# Patient Record
Sex: Female | Born: 1955 | Race: White | Hispanic: No | Marital: Married | Smoking: Former smoker
Health system: Southern US, Community
[De-identification: ages and names within clinical notes are randomized; demographics above are authoritative.]

## PROBLEM LIST (undated history)

## (undated) DIAGNOSIS — E785 Hyperlipidemia, unspecified: Secondary | ICD-10-CM

## (undated) DIAGNOSIS — R519 Headache, unspecified: Secondary | ICD-10-CM

## (undated) DIAGNOSIS — E079 Disorder of thyroid, unspecified: Secondary | ICD-10-CM

## (undated) DIAGNOSIS — M1990 Unspecified osteoarthritis, unspecified site: Secondary | ICD-10-CM

## (undated) DIAGNOSIS — M419 Scoliosis, unspecified: Secondary | ICD-10-CM

## (undated) DIAGNOSIS — I10 Essential (primary) hypertension: Secondary | ICD-10-CM

## (undated) DIAGNOSIS — F32A Depression, unspecified: Secondary | ICD-10-CM

## (undated) DIAGNOSIS — Z974 Presence of external hearing-aid: Secondary | ICD-10-CM

## (undated) DIAGNOSIS — Z872 Personal history of diseases of the skin and subcutaneous tissue: Secondary | ICD-10-CM

## (undated) DIAGNOSIS — T4145XA Adverse effect of unspecified anesthetic, initial encounter: Secondary | ICD-10-CM

## (undated) DIAGNOSIS — T8859XA Other complications of anesthesia, initial encounter: Secondary | ICD-10-CM

## (undated) DIAGNOSIS — F419 Anxiety disorder, unspecified: Secondary | ICD-10-CM

## (undated) DIAGNOSIS — I341 Nonrheumatic mitral (valve) prolapse: Secondary | ICD-10-CM

## (undated) DIAGNOSIS — Z973 Presence of spectacles and contact lenses: Secondary | ICD-10-CM

## (undated) DIAGNOSIS — I499 Cardiac arrhythmia, unspecified: Secondary | ICD-10-CM

## (undated) DIAGNOSIS — L570 Actinic keratosis: Secondary | ICD-10-CM

## (undated) DIAGNOSIS — R51 Headache: Secondary | ICD-10-CM

## (undated) DIAGNOSIS — F329 Major depressive disorder, single episode, unspecified: Secondary | ICD-10-CM

## (undated) HISTORY — DX: Anxiety disorder, unspecified: F41.9

## (undated) HISTORY — PX: TYMPANOMASTOIDECTOMY: SHX34

## (undated) HISTORY — DX: Major depressive disorder, single episode, unspecified: F32.9

## (undated) HISTORY — DX: Hyperlipidemia, unspecified: E78.5

## (undated) HISTORY — DX: Depression, unspecified: F32.A

## (undated) HISTORY — DX: Disorder of thyroid, unspecified: E07.9

## (undated) HISTORY — DX: Actinic keratosis: L57.0

## (undated) HISTORY — PX: CERVICAL FUSION: SHX112

## (undated) HISTORY — PX: TONSILLECTOMY: SUR1361

## (undated) HISTORY — PX: CRYOTHERAPY: SHX1416

---

## 2000-01-09 ENCOUNTER — Other Ambulatory Visit: Admission: RE | Admit: 2000-01-09 | Discharge: 2000-01-09 | Payer: Self-pay | Admitting: Gynecology

## 2001-07-02 ENCOUNTER — Observation Stay (HOSPITAL_COMMUNITY): Admission: RE | Admit: 2001-07-02 | Discharge: 2001-07-02 | Payer: Self-pay | Admitting: Neurosurgery

## 2001-07-02 ENCOUNTER — Encounter: Payer: Self-pay | Admitting: Neurosurgery

## 2001-07-08 ENCOUNTER — Encounter: Payer: Self-pay | Admitting: Neurosurgery

## 2001-07-08 ENCOUNTER — Encounter: Admission: RE | Admit: 2001-07-08 | Discharge: 2001-07-08 | Payer: Self-pay | Admitting: Neurosurgery

## 2001-07-30 ENCOUNTER — Other Ambulatory Visit: Admission: RE | Admit: 2001-07-30 | Discharge: 2001-07-30 | Payer: Self-pay | Admitting: Gynecology

## 2001-08-14 ENCOUNTER — Encounter: Admission: RE | Admit: 2001-08-14 | Discharge: 2001-08-14 | Payer: Self-pay | Admitting: Neurosurgery

## 2001-08-14 ENCOUNTER — Encounter: Payer: Self-pay | Admitting: Neurosurgery

## 2004-08-26 ENCOUNTER — Ambulatory Visit: Payer: Self-pay | Admitting: Family Medicine

## 2004-10-17 ENCOUNTER — Ambulatory Visit: Payer: Self-pay | Admitting: General Surgery

## 2004-10-20 ENCOUNTER — Ambulatory Visit: Payer: Self-pay | Admitting: Family Medicine

## 2005-11-07 ENCOUNTER — Ambulatory Visit: Payer: Self-pay | Admitting: Family Medicine

## 2007-01-08 ENCOUNTER — Ambulatory Visit: Payer: Self-pay | Admitting: Family Medicine

## 2008-03-10 ENCOUNTER — Ambulatory Visit: Payer: Self-pay | Admitting: Family Medicine

## 2009-04-21 ENCOUNTER — Ambulatory Visit: Payer: Self-pay | Admitting: Family Medicine

## 2010-04-22 ENCOUNTER — Ambulatory Visit: Payer: Self-pay | Admitting: Family Medicine

## 2011-05-10 ENCOUNTER — Ambulatory Visit: Payer: Self-pay | Admitting: Internal Medicine

## 2011-07-14 IMAGING — MG MAM DGTL SCREENING MAMMO W/CAD
1 series · 4 of 4 positions shown · non-contrast
Comparison: none

RESULT:      Comparison is made to a study 10 March, 2008 as well as 08 January, 2007 and 20 October, 2004.

The breasts exhibit a moderate dense parenchymal pattern.  There is no
dominant mass. There are no malignant-appearing groupings of
microcalcification.

[R CC · right · 4 of 4 slices shown]
[im 1/4]
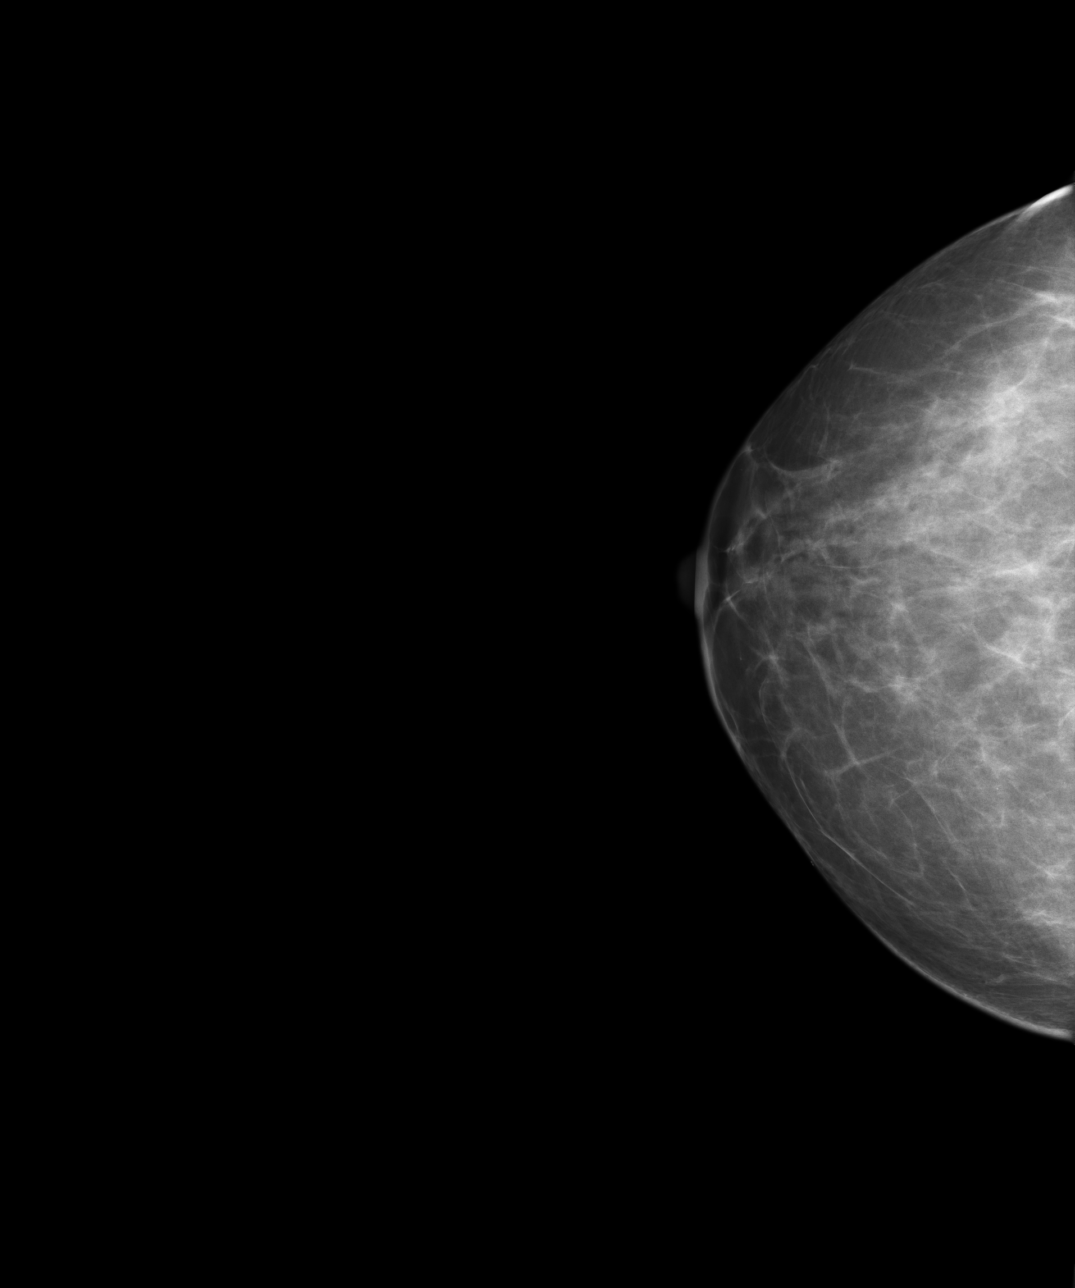
[im 2/4]
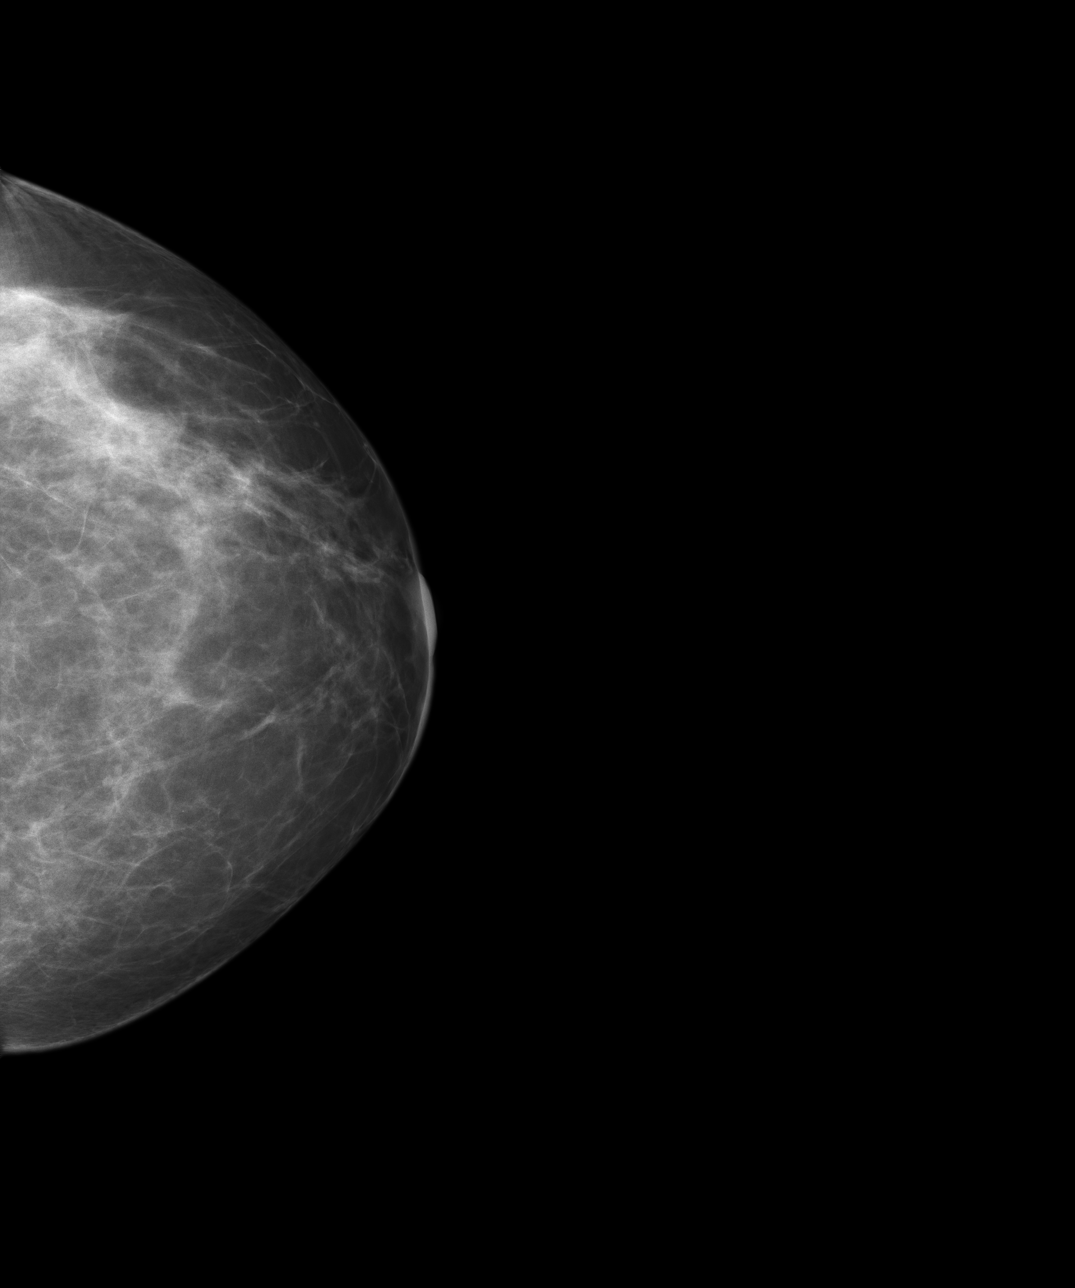
[im 3/4]
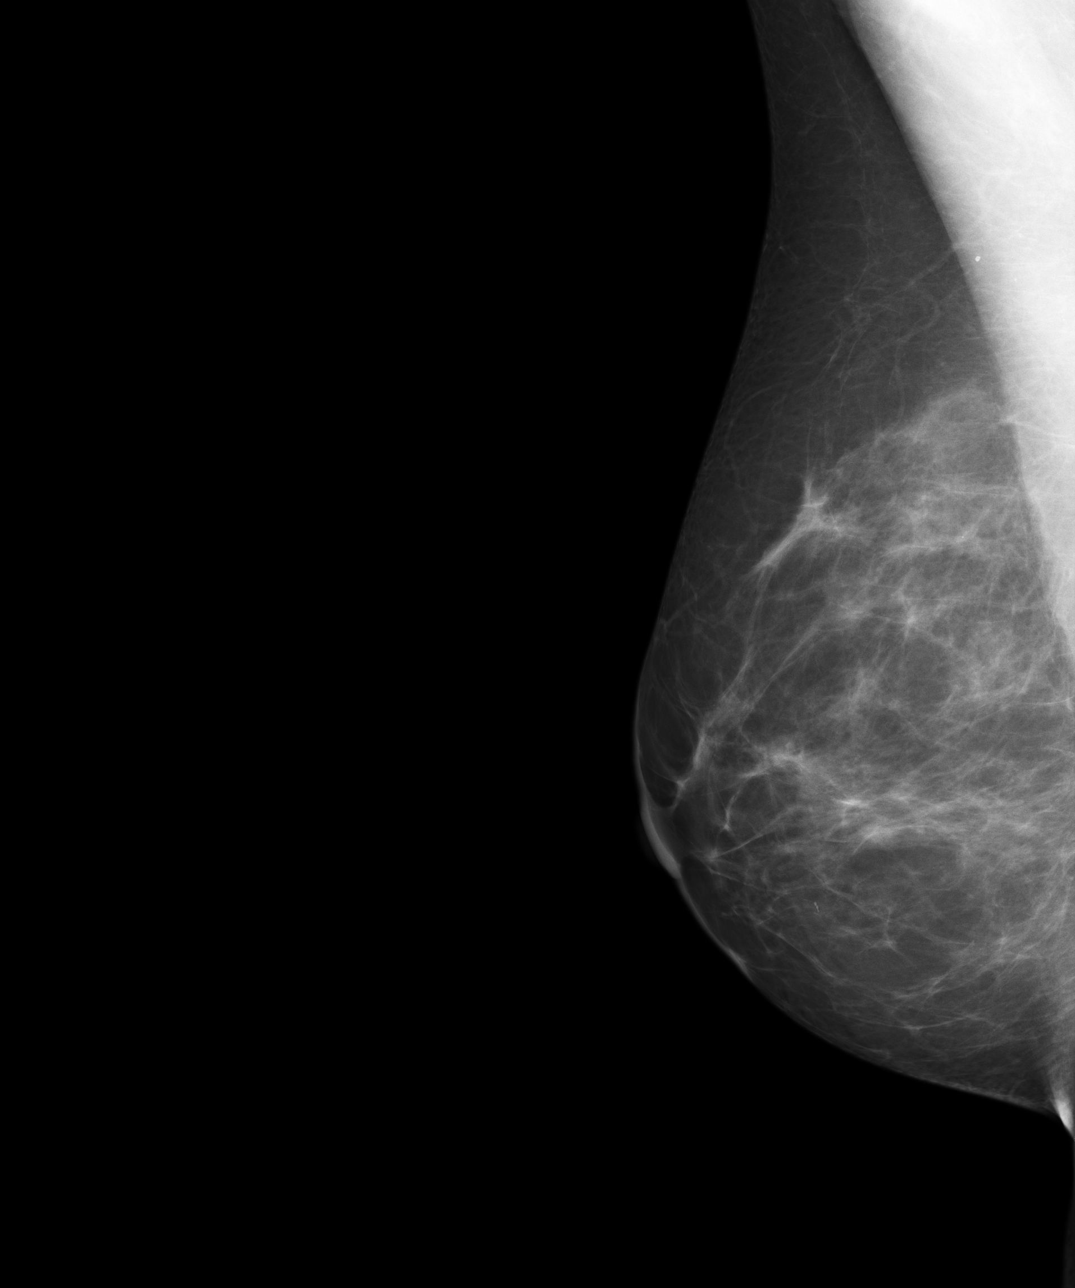
[im 4/4]
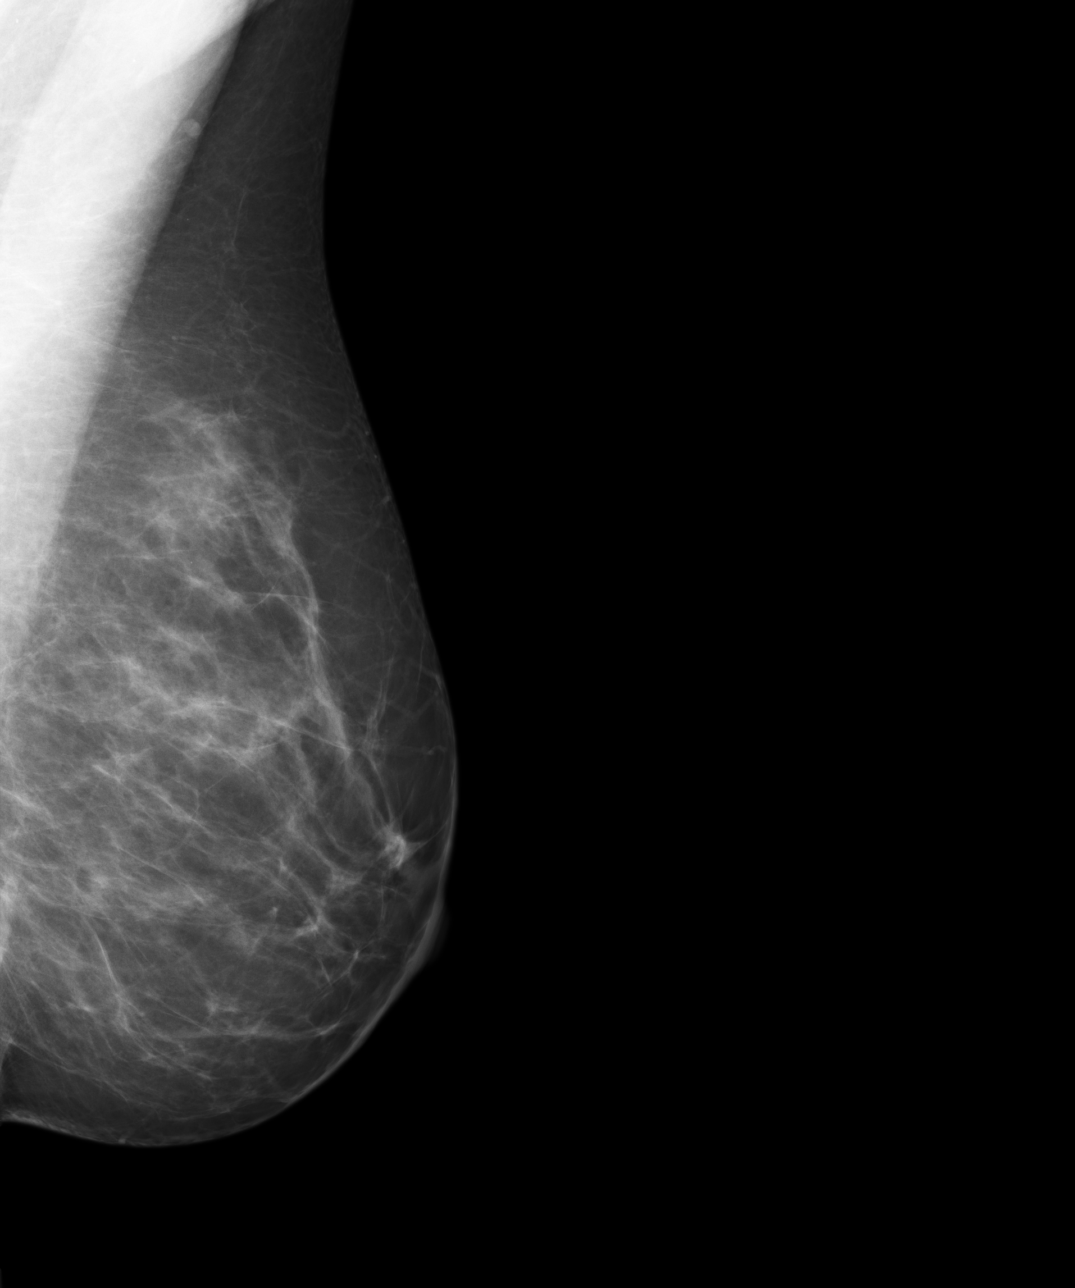

[4 of 4 positions shown; findings below may reference images not displayed]

IMPRESSION: 1.      I do not see findings suspicious for malignancy.
2.      BI-RADS:  Category 2- Benign Finding.

RECOMMENDATION:  Please continue to encourage yearly mammographic follow up.

A negative mammogram report does not preclude biopsy or other evaluation of
a clinically palpable or otherwise suspicious mass or lesion.  Breast cancer
may not be detected by mammography in up to 10% of cases.

## 2012-05-21 ENCOUNTER — Ambulatory Visit: Payer: Self-pay | Admitting: Internal Medicine

## 2012-08-13 ENCOUNTER — Ambulatory Visit: Payer: Self-pay | Admitting: Internal Medicine

## 2013-05-29 ENCOUNTER — Ambulatory Visit: Payer: Self-pay | Admitting: Internal Medicine

## 2013-09-01 ENCOUNTER — Ambulatory Visit: Payer: Self-pay | Admitting: Obstetrics and Gynecology

## 2013-10-09 HISTORY — PX: COLONOSCOPY: SHX174

## 2014-06-05 ENCOUNTER — Ambulatory Visit: Payer: Self-pay | Admitting: Gastroenterology

## 2014-06-08 LAB — PATHOLOGY REPORT

## 2014-06-24 ENCOUNTER — Ambulatory Visit: Payer: Self-pay | Admitting: Internal Medicine

## 2014-09-11 ENCOUNTER — Ambulatory Visit: Payer: Self-pay | Admitting: Obstetrics and Gynecology

## 2015-01-22 ENCOUNTER — Other Ambulatory Visit: Payer: Self-pay | Admitting: Unknown Physician Specialty

## 2015-01-22 DIAGNOSIS — E041 Nontoxic single thyroid nodule: Secondary | ICD-10-CM

## 2015-03-15 ENCOUNTER — Ambulatory Visit
Admission: RE | Admit: 2015-03-15 | Discharge: 2015-03-15 | Disposition: A | Discharge status: Home / Self Care | Payer: BLUE CROSS/BLUE SHIELD | Source: Ambulatory Visit | Attending: Unknown Physician Specialty | Admitting: Unknown Physician Specialty

## 2015-03-15 DIAGNOSIS — E042 Nontoxic multinodular goiter: Secondary | ICD-10-CM | POA: Diagnosis present

## 2015-03-15 DIAGNOSIS — E041 Nontoxic single thyroid nodule: Secondary | ICD-10-CM

## 2015-03-17 ENCOUNTER — Other Ambulatory Visit: Payer: Self-pay | Admitting: Unknown Physician Specialty

## 2015-03-17 DIAGNOSIS — D440 Neoplasm of uncertain behavior of thyroid gland: Secondary | ICD-10-CM

## 2015-04-09 DIAGNOSIS — Z872 Personal history of diseases of the skin and subcutaneous tissue: Secondary | ICD-10-CM

## 2015-04-09 HISTORY — DX: Personal history of diseases of the skin and subcutaneous tissue: Z87.2

## 2015-05-10 ENCOUNTER — Encounter: Payer: Self-pay | Admitting: *Deleted

## 2015-05-12 NOTE — Discharge Instructions (Signed)
[facility] °[facility] ° °POST OPERATIVE INSTRUCTIONS FOR DR. TROXLER AND DR. FOWLER °[facility] ° ° °1. Take your medication as prescribed.  Pain medication should be taken only as needed. ° °2. Keep the dressing clean, dry and intact. ° °3. Keep your foot elevated above the heart level for the first 48 hours. ° °4. Walking to the bathroom and brief periods of walking are acceptable, unless we have instructed you to be non-weight bearing. ° °5. Always wear your post-op shoe when walking.  Always use your crutches if you are to be non-weight bearing. ° °6. Do not take a shower. Baths are permissible as long as the foot is kept out of the water.  ° °7. Every hour you are awake:  °- Bend your knee 15 times. °- Flex foot 15 times °- Massage calf 15 times ° °8. Call [facility] ([phone]) if any of the following problems occur: °- You develop a temperature or fever. °- The bandage becomes saturated with blood. °- Medication does not stop your pain. °- Injury of the foot occurs. °- Any symptoms of infection including redness, odor, or red streaks running from wound. °-  ° °General Anesthesia, Care After °Refer to this sheet in the next few weeks. These instructions provide you with information on caring for yourself after your procedure. Your health care provider may also give you more specific instructions. Your treatment has been planned according to current medical practices, but problems sometimes occur. Call your health care provider if you have any problems or questions after your procedure. °WHAT TO EXPECT AFTER THE PROCEDURE °After the procedure, it is typical to experience: °· Sleepiness. °· Nausea and vomiting. °HOME CARE INSTRUCTIONS °· For the first 24 hours after general anesthesia: °¨ Have a responsible person with you. °¨ Do not drive a car. If you are alone, do not take public transportation. °¨ Do not drink alcohol. °¨ Do not take medicine  that has not been prescribed by your health care provider. °¨ Do not sign important papers or make important decisions. °¨ You may resume a normal diet and activities as directed by your health care provider. °· Change bandages (dressings) as directed. °· If you have questions or problems that seem related to general anesthesia, call the hospital and ask for the anesthetist or anesthesiologist on call. °SEEK MEDICAL CARE IF: °· You have nausea and vomiting that continue the day after anesthesia. °· You develop a rash. °SEEK IMMEDIATE MEDICAL CARE IF:  °· You have difficulty breathing. °· You have chest pain. °· You have any allergic problems. °Document Released: 01/01/2001 Document Revised: 09/30/2013 Document Reviewed: 04/10/2013 °ExitCare® Patient Information ©2015 [employer]. This information is not intended to replace advice given to you by your health care provider. Make sure you discuss any questions you have with your health care provider. ° °

## 2015-05-13 ENCOUNTER — Encounter: Payer: Self-pay | Admitting: Anesthesiology

## 2015-05-13 ENCOUNTER — Ambulatory Visit: Payer: BLUE CROSS/BLUE SHIELD | Admitting: Anesthesiology

## 2015-05-13 ENCOUNTER — Encounter
Admission: RE | Disposition: A | Discharge status: Home / Self Care | Payer: Self-pay | Source: Ambulatory Visit | Attending: Podiatry

## 2015-05-13 ENCOUNTER — Ambulatory Visit
Admission: RE | Admit: 2015-05-13 | Discharge: 2015-05-13 | Disposition: A | Discharge status: Home / Self Care | Payer: BLUE CROSS/BLUE SHIELD | Source: Ambulatory Visit | Attending: Podiatry | Admitting: Podiatry

## 2015-05-13 DIAGNOSIS — S92342A Displaced fracture of fourth metatarsal bone, left foot, initial encounter for closed fracture: Secondary | ICD-10-CM | POA: Insufficient documentation

## 2015-05-13 DIAGNOSIS — Z79899 Other long term (current) drug therapy: Secondary | ICD-10-CM | POA: Insufficient documentation

## 2015-05-13 DIAGNOSIS — Z79891 Long term (current) use of opiate analgesic: Secondary | ICD-10-CM | POA: Insufficient documentation

## 2015-05-13 DIAGNOSIS — Z791 Long term (current) use of non-steroidal anti-inflammatories (NSAID): Secondary | ICD-10-CM | POA: Diagnosis not present

## 2015-05-13 DIAGNOSIS — Z91018 Allergy to other foods: Secondary | ICD-10-CM | POA: Insufficient documentation

## 2015-05-13 DIAGNOSIS — Z9109 Other allergy status, other than to drugs and biological substances: Secondary | ICD-10-CM | POA: Insufficient documentation

## 2015-05-13 DIAGNOSIS — E785 Hyperlipidemia, unspecified: Secondary | ICD-10-CM | POA: Diagnosis not present

## 2015-05-13 DIAGNOSIS — Z7982 Long term (current) use of aspirin: Secondary | ICD-10-CM | POA: Diagnosis not present

## 2015-05-13 DIAGNOSIS — X58XXXA Exposure to other specified factors, initial encounter: Secondary | ICD-10-CM | POA: Diagnosis not present

## 2015-05-13 DIAGNOSIS — G8929 Other chronic pain: Secondary | ICD-10-CM | POA: Insufficient documentation

## 2015-05-13 DIAGNOSIS — I341 Nonrheumatic mitral (valve) prolapse: Secondary | ICD-10-CM | POA: Diagnosis not present

## 2015-05-13 DIAGNOSIS — I491 Atrial premature depolarization: Secondary | ICD-10-CM | POA: Diagnosis not present

## 2015-05-13 DIAGNOSIS — M216X2 Other acquired deformities of left foot: Secondary | ICD-10-CM | POA: Insufficient documentation

## 2015-05-13 DIAGNOSIS — I38 Endocarditis, valve unspecified: Secondary | ICD-10-CM | POA: Diagnosis not present

## 2015-05-13 DIAGNOSIS — I10 Essential (primary) hypertension: Secondary | ICD-10-CM | POA: Diagnosis not present

## 2015-05-13 DIAGNOSIS — F419 Anxiety disorder, unspecified: Secondary | ICD-10-CM | POA: Insufficient documentation

## 2015-05-13 DIAGNOSIS — Z87891 Personal history of nicotine dependence: Secondary | ICD-10-CM | POA: Diagnosis not present

## 2015-05-13 DIAGNOSIS — S92352A Displaced fracture of fifth metatarsal bone, left foot, initial encounter for closed fracture: Secondary | ICD-10-CM | POA: Insufficient documentation

## 2015-05-13 DIAGNOSIS — F329 Major depressive disorder, single episode, unspecified: Secondary | ICD-10-CM | POA: Insufficient documentation

## 2015-05-13 DIAGNOSIS — Z7951 Long term (current) use of inhaled steroids: Secondary | ICD-10-CM | POA: Insufficient documentation

## 2015-05-13 HISTORY — DX: Presence of spectacles and contact lenses: Z97.3

## 2015-05-13 HISTORY — DX: Nonrheumatic mitral (valve) prolapse: I34.1

## 2015-05-13 HISTORY — PX: METATARSAL OSTEOTOMY: SHX1641

## 2015-05-13 HISTORY — DX: Headache: R51

## 2015-05-13 HISTORY — DX: Personal history of diseases of the skin and subcutaneous tissue: Z87.2

## 2015-05-13 HISTORY — DX: Cardiac arrhythmia, unspecified: I49.9

## 2015-05-13 HISTORY — DX: Presence of external hearing-aid: Z97.4

## 2015-05-13 HISTORY — DX: Headache, unspecified: R51.9

## 2015-05-13 HISTORY — DX: Essential (primary) hypertension: I10

## 2015-05-13 HISTORY — DX: Unspecified osteoarthritis, unspecified site: M19.90

## 2015-05-13 HISTORY — PX: ORIF TOE FRACTURE: SHX5032

## 2015-05-13 HISTORY — DX: Other complications of anesthesia, initial encounter: T88.59XA

## 2015-05-13 HISTORY — DX: Scoliosis, unspecified: M41.9

## 2015-05-13 HISTORY — DX: Adverse effect of unspecified anesthetic, initial encounter: T41.45XA

## 2015-05-13 SURGERY — OSTEOTOMY, METATARSAL BONE
Anesthesia: Regional | Laterality: Left | Wound class: Clean

## 2015-05-13 MED ORDER — LACTATED RINGERS IV SOLN
INTRAVENOUS | Status: DC
  Administered 2015-05-13: 08:00:00 via INTRAVENOUS

## 2015-05-13 MED ORDER — OXYCODONE HCL 5 MG PO TABS: 5.0000 mg | ORAL_TABLET | Freq: Once | ORAL | Status: AC | PRN

## 2015-05-13 MED ORDER — GLYCOPYRROLATE 0.2 MG/ML IJ SOLN
INTRAMUSCULAR | Status: DC | PRN
  Administered 2015-05-13: 0.1 mg via INTRAVENOUS

## 2015-05-13 MED ORDER — ONDANSETRON HCL 4 MG/2ML IJ SOLN
INTRAMUSCULAR | Status: DC | PRN
  Administered 2015-05-13: 4 mg via INTRAVENOUS

## 2015-05-13 MED ORDER — PROPOFOL 10 MG/ML IV BOLUS
INTRAVENOUS | Status: DC | PRN
  Administered 2015-05-13: 150 mg via INTRAVENOUS

## 2015-05-13 MED ORDER — PROMETHAZINE HCL 25 MG/ML IJ SOLN: 12.5000 mg | Freq: Once | INTRAMUSCULAR | Status: DC | PRN

## 2015-05-13 MED ORDER — ROPIVACAINE HCL 5 MG/ML IJ SOLN
INTRAMUSCULAR | Status: DC | PRN
  Administered 2015-05-13: 40 mL via PERINEURAL

## 2015-05-13 MED ORDER — LIDOCAINE HCL (CARDIAC) 20 MG/ML IV SOLN
INTRAVENOUS | Status: DC | PRN
  Administered 2015-05-13: 50 mg via INTRATRACHEAL

## 2015-05-13 MED ORDER — MIDAZOLAM HCL 2 MG/2ML IJ SOLN
INTRAMUSCULAR | Status: DC | PRN
  Administered 2015-05-13 (×2): 2 mg via INTRAVENOUS

## 2015-05-13 MED ORDER — DEXAMETHASONE SODIUM PHOSPHATE 4 MG/ML IJ SOLN
INTRAMUSCULAR | Status: DC | PRN
  Administered 2015-05-13: 4 mg via PERINEURAL

## 2015-05-13 MED ORDER — SCOPOLAMINE 1 MG/3DAYS TD PT72
1.0000 | MEDICATED_PATCH | TRANSDERMAL | Status: DC
  Administered 2015-05-13: 1.5 mg via TRANSDERMAL

## 2015-05-13 MED ORDER — ENOXAPARIN SODIUM 40 MG/0.4ML ~~LOC~~ SOLN
40.0000 mg | Freq: Once | SUBCUTANEOUS | Status: AC
  Administered 2015-05-13: 40 mg via SUBCUTANEOUS

## 2015-05-13 MED ORDER — FENTANYL CITRATE (PF) 100 MCG/2ML IJ SOLN
INTRAMUSCULAR | Status: DC | PRN
  Administered 2015-05-13: 25 ug via INTRAVENOUS
  Administered 2015-05-13 (×2): 50 ug via INTRAVENOUS
  Administered 2015-05-13: 25 ug via INTRAVENOUS
  Administered 2015-05-13: 100 ug via INTRAVENOUS
  Administered 2015-05-13: 25 ug via INTRAVENOUS
  Administered 2015-05-13 (×2): 12.5 ug via INTRAVENOUS

## 2015-05-13 MED ORDER — CEFAZOLIN SODIUM-DEXTROSE 2-3 GM-% IV SOLR
2.0000 g | Freq: Once | INTRAVENOUS | Status: AC
  Administered 2015-05-13: 2 g via INTRAVENOUS

## 2015-05-13 MED ORDER — OXYCODONE HCL 5 MG/5ML PO SOLN
5.0000 mg | Freq: Once | ORAL | Status: AC | PRN
  Administered 2015-05-13: 5 mg via ORAL

## 2015-05-13 SURGICAL SUPPLY — 79 items
APL SKNCLS STERI-STRIP NONHPOA (GAUZE/BANDAGES/DRESSINGS) ×1
BANDAGE ELASTIC 4 CLIP NS LF (GAUZE/BANDAGES/DRESSINGS) ×3 IMPLANT
BENZOIN TINCTURE PRP APPL 2/3 (GAUZE/BANDAGES/DRESSINGS) ×3 IMPLANT
BIT DRILL 2 FAST STEP (BIT) ×3 IMPLANT
BLADE CRESCENTIC (BLADE) IMPLANT
BLADE MED AGGRESSIVE (BLADE) IMPLANT
BLADE MINI RND TIP GREEN BEAV (BLADE) IMPLANT
BLADE OSC/SAGITTAL 5.5X25 (BLADE) IMPLANT
BLADE OSC/SAGITTAL MD 5.5X18 (BLADE) IMPLANT
BLADE OSC/SAGITTAL MD 9X18.5 (BLADE) ×3 IMPLANT
BLADE OSCILLATING/SAGITTAL (BLADE) ×3
BLADE SURG 15 STRL LF DISP TIS (BLADE) IMPLANT
BLADE SURG 15 STRL SS (BLADE)
BLADE SW THK.38XMED LNG THN (BLADE) ×1 IMPLANT
BNDG CMPR 75X41 PLY HI ABS (GAUZE/BANDAGES/DRESSINGS) ×1
BNDG ESMARK 4X12 TAN STRL LF (GAUZE/BANDAGES/DRESSINGS) ×3 IMPLANT
BNDG GAUZE 4.5X4.1 6PLY STRL (MISCELLANEOUS) ×3 IMPLANT
BNDG STRETCH 4X75 STRL LF (GAUZE/BANDAGES/DRESSINGS) ×3 IMPLANT
BUR EGG 4X8 MED (BURR) IMPLANT
BUR STRYKR EGG 5.0 (BURR) IMPLANT
CANISTER SUCT 1200ML W/VALVE (MISCELLANEOUS) ×3 IMPLANT
CAST PADDING 3X4FT ST 30246 (SOFTGOODS) ×4
CLOSURE WOUND 1/4X4 (GAUZE/BANDAGES/DRESSINGS) ×1
COVER PIN YLW 0.028-062 (MISCELLANEOUS) IMPLANT
CUFF TOURN SGL QUICK 18 (TOURNIQUET CUFF) ×3 IMPLANT
DRAPE FLUOR MINI C-ARM 54X84 (DRAPES) ×3 IMPLANT
DRILL BIT 3.5MM ×3 IMPLANT
DRILL WIRE PASS (DRILL) IMPLANT
DURAPREP 26ML APPLICATOR (WOUND CARE) ×3 IMPLANT
ETHIBOND 2 0 GREEN CT 2 30IN (SUTURE) IMPLANT
ETHICON SURGICAL STAINLESS STEELE WIRE (Wire) ×3 IMPLANT
GAUZE PETRO XEROFOAM 1X8 (MISCELLANEOUS) ×3 IMPLANT
GAUZE PETRO XEROFOAM 5X9 (MISCELLANEOUS) IMPLANT
GAUZE SPONGE 4X4 12PLY STRL (GAUZE/BANDAGES/DRESSINGS) ×3 IMPLANT
GLOVE BIO SURGEON STRL SZ7.5 (GLOVE) ×6 IMPLANT
GLOVE BIO SURGEON STRL SZ8 (GLOVE) ×3 IMPLANT
GLOVE INDICATOR 8.0 STRL GRN (GLOVE) ×6 IMPLANT
GOWN STRL REUS W/ TWL XL LVL3 (GOWN DISPOSABLE) ×3 IMPLANT
GOWN STRL REUS W/TWL XL LVL3 (GOWN DISPOSABLE) ×9
K-WIRE DBL END TROCAR 6X.045 (WIRE) ×3
K-WIRE DBL END TROCAR 6X.062 (WIRE)
K-WIRE SMOOTH 1.8MM ×3 IMPLANT
KWIRE DBL END TROCAR 6X.045 (WIRE) ×1 IMPLANT
KWIRE DBL END TROCAR 6X.062 (WIRE) IMPLANT
NEEDLE HYPO 18GX1.5 BLUNT FILL (NEEDLE) IMPLANT
NEEDLE HYPO 25GX1X1/2 BEV (NEEDLE) IMPLANT
NS IRRIG 500ML POUR BTL (IV SOLUTION) ×3 IMPLANT
PACK EXTREMITY ARMC (MISCELLANEOUS) ×3 IMPLANT
PAD CAST CTTN 3X4 STRL (SOFTGOODS) ×2 IMPLANT
PAD GROUND ADULT SPLIT (MISCELLANEOUS) ×3 IMPLANT
PADDING CAST COTTON 3X4 STRL (SOFTGOODS) ×2
PLATE F3 FRAG HI FLEX RT (Plate) ×3 IMPLANT
RASP SM TEAR CROSS CUT (RASP) IMPLANT
SCREW 5.0 X 38 (Screw) ×2 IMPLANT
SCREW PEG 2.5X12 NONLOCK (Screw) ×6 IMPLANT
SCREW PEG 2.5X16 NONLOCK (Screw) IMPLANT
SCREW PEG LOCK 2.5X10 (Peg) ×6 IMPLANT
SCREW PEG LOCK 2.5X12 (Screw) ×3 IMPLANT
SCREW PEG LOCK 2.5X14 (Peg) ×3 IMPLANT
SPLINT CAST 1 STEP 4X30 (MISCELLANEOUS) IMPLANT
SPLINT FAST PLASTER 5X30 (CAST SUPPLIES) ×2
SPLINT PLASTER CAST FAST 5X30 (CAST SUPPLIES) ×1 IMPLANT
STOCKINETTE STRL 6IN 960660 (GAUZE/BANDAGES/DRESSINGS) ×3 IMPLANT
STRIP CLOSURE SKIN 1/4X4 (GAUZE/BANDAGES/DRESSINGS) ×2 IMPLANT
SUT ETHILON 4-0 (SUTURE) ×3
SUT ETHILON 4-0 FS2 18XMFL BLK (SUTURE) ×1
SUT ETHILON 5-0 FS-2 18 BLK (SUTURE) IMPLANT
SUT VIC AB 1 CT1 36 (SUTURE) IMPLANT
SUT VIC AB 2-0 CT1 27 (SUTURE)
SUT VIC AB 2-0 CT1 TAPERPNT 27 (SUTURE) IMPLANT
SUT VIC AB 2-0 SH 27 (SUTURE)
SUT VIC AB 2-0 SH 27XBRD (SUTURE) IMPLANT
SUT VIC AB 3-0 SH 27 (SUTURE)
SUT VIC AB 3-0 SH 27X BRD (SUTURE) IMPLANT
SUT VIC AB 4-0 FS2 27 (SUTURE) ×6 IMPLANT
SUT VICRYL AB 3-0 FS1 BRD 27IN (SUTURE) IMPLANT
SUTURE ETHLN 4-0 FS2 18XMF BLK (SUTURE) ×1 IMPLANT
SYRINGE 10CC LL (SYRINGE) IMPLANT
WATER STERILE IRR 1000ML POUR (IV SOLUTION) IMPLANT

## 2015-05-13 NOTE — Anesthesia Preprocedure Evaluation (Addendum)
Anesthesia Evaluation  Patient identified by MRN, date of birth, ID band  Reviewed: NPO status   History of Anesthesia Complications (+) PONV and history of anesthetic complications  Airway Mallampati: II  TM Distance: >3 FB Neck ROM: full   Comment: H/o ACDF Dental no notable dental hx.    Pulmonary neg pulmonary ROS, former smoker,    Pulmonary exam normal       Cardiovascular Exercise Tolerance: Good hypertension, Normal cardiovascular exam+ dysrhythmias (palpitations)  Abnl stress echo in 02/2015; Cards note 03/2015; Spoke to Dr. Nehemiah Massed > pt ok to proceed for surgery.  Lipids;   Neuro/Psych  Headaches, Anxiety    GI/Hepatic negative GI ROS, Neg liver ROS,   Endo/Other  negative endocrine ROS  Renal/GU negative Renal ROS  negative genitourinary   Musculoskeletal  (+) Arthritis -, acdfusion 2002;  Moderate scoliosis;    Abdominal   Peds  Hematology negative hematology ROS (+)   Anesthesia Other Findings Last aspirin 5 days ago;  Reproductive/Obstetrics negative OB ROS                            Anesthesia Physical Anesthesia Plan  ASA: II  Anesthesia Plan: General and Regional   Post-op Pain Management: GA combined w/ Regional for post-op pain   Induction:   Airway Management Planned:   Additional Equipment:   Intra-op Plan:   Post-operative Plan:   Informed Consent: I have reviewed the patients History and Physical, chart, labs and discussed the procedure including the risks, benefits and alternatives for the proposed anesthesia with the patient or authorized representative who has indicated his/her understanding and acceptance.     Plan Discussed with: CRNA  Anesthesia Plan Comments: (Popliteal block.)       Anesthesia Quick Evaluation

## 2015-05-13 NOTE — Anesthesia Postprocedure Evaluation (Signed)
  Anesthesia Post-op Note  Patient: Carolyn Richard  Procedure(s) Performed: Procedure(s) with comments: METATARSAL OSTEOTOMY LEFT 2ND (Left) - LEFT 2ND METATARSAL OPEN REDUCTION INTERNAL FIXATION (ORIF) METATARSAL (TOE) FRACTURE LEFT 4TH & 5TH (Left) - 4TH AND 5TH METATARSAL  Anesthesia type:General, Regional  Patient location: PACU  Post pain: Pain level controlled  Post assessment: Post-op Vital signs reviewed, Patient's Cardiovascular Status Stable, Respiratory Function Stable, Patent Airway and No signs of Nausea or vomiting  Post vital signs: Reviewed and stable  Last Vitals:  Filed Vitals:   05/13/15 1215  BP: 138/77  Pulse: 77  Temp:   Resp: 12    Level of consciousness: awake, alert  and patient cooperative  Complications: No apparent anesthesia complications  Pt has Lateral foot pain, in the area of the 5th digit.   Dr. Elvina Mattes unwrapped the foot and injected some local in the appropriate area.

## 2015-05-13 NOTE — H&P (Signed)
No changes to Hand P.  Reviewed this am prior to surgery.

## 2015-05-13 NOTE — Op Note (Signed)
Operative note   Surgeon: Dr. Albertine Patricia, DPM.    Assistant: None    Preop diagnosis: #1 displaced fourth metatarsal fracture left foot midshaft region. #2 Jones fracture fifth metatarsal left foot #3 plantar displaced deviated second metatarsal left foot    Postop diagnosis: Same    Procedure:   1. ORIF fourth metatarsal fracture left foot with plate and screw fixation   2. ORIF fifth metatarsal Jones fracture with 50 cannulated screw fixation intramedullary canal   3. Osteotomy second metatarsal left foot     EBL: Negligible    Anesthesia:general with popliteal block    Hemostasis: Ankle tourniquet 250 mmHg pressure    Specimen: None    Complications: None    Operative indications: Displaced fractures and chronic pain to the second metatarsophalangeal joint.    Procedure:  Patient was brought into the OR and placed on the operating table in thesupine position. After anesthesia was obtained theleft lower extremity was prepped and draped in usual sterile fashion.  Operative Report: At this time attention was directed to the dorsum of the left foot where a 4 cm dorsolinear skin incision made over the fourth metatarsal shaft. This incision was deepened sharp blunt dissection bleeders clamped and bovied as required. Extensive hemorrhage was noted throughout the tissues and the dorsum of the foot. The periosteal tissue overlying the bone was incised at this point from proximal shaft to the head of the metatarsal. The fracture fragment was noted and freed. This was then reduced by using combination of extraction and compression and the plate was sized up and fitted for the fracture margin. A screw was placed in distally and medially and the Biomet plate there is checked FluoroScan in excellent position and correction was noted. The screws were tightened rest of the way and 4 more screws were placed in the plate. A r irrigated. Periosteum was enclosed with 4 Vicryl in continuous  stitch as were deep superficial fascial layers. Skin was closed with 4 Vicryl in a subcuticular stitch.  Procedure #2: This time to his directed to the fifth metatarsal tuberosity where percutaneous wire from the fabrication of a screw set was utilized to run through the shaft from proximal to distal starting at the tuberosity area. Once is in place it was checked thoroughly with multiple views using FluoroScan to make sure he was running down the shaft of the metatarsal. Once this was confirmed the area was drilled and a 38 mm 5 cannulated screw was placed across the fracture margin and through the shaft and canal of the fifth metatarsal. This was seen to compress the fracture mental and fixated area nicely on FluoroScan. FluoroScan views showed the screw was completely within the metatarsal shaft. The guidewire was removed the area was irrigated and skin was closed with 1 5-0 nylon horizontal mattress suture.  Procedure #3: This time to his directed to the second metatarsal phalangeal joint dorsally and a 3 cm linear incision was made over this region. Incision was deepened with sharp and blunt dissection. Bleeders clamped and bovied as required. Long extension was notified retracted laterally. An incision was made through the tissue down to bone. The distal shaft and head was exposed and osteotomies are performed starting at the osteochondral junction running from dorsal distal to plantar proximal. The osteotomy was through and through the metatarsal head was shifted to a more medial position temporally fixated with a K wire checked FluoroScan for good position and correction. Once this was noted a 2.2 mm  screws placed across the osteotomy fixation and the K wire was removed. FluoroScan showed good correction fixation. His time the area was copiously irrigated periosteal Tissue closed with 4 Vicryl in a continuous stitch as were deep superficial fascial layers. Skin closed with 4 Vicryl subcuticular  stitch.  This point sterile compressive dressing was placed across wound consisting of Steri-Strips Xeroform gauze 4 x 4's Kling Kerlix. The tourniquet was released and prompt prevascular seen to return all digits. A posterior splint was placed on the left foot and leg in the operating room.    Patient tolerated the procedure and anesthesia well.  Was transported from the OR to the PACU with all vital signs stable and vascular status intact. To be discharged per routine protocol.  Will follow up in approximately 1 week in the outpatient clinic.

## 2015-05-13 NOTE — Transfer of Care (Signed)
Immediate Anesthesia Transfer of Care Note  Patient: Carolyn Richard  Procedure(s) Performed: Procedure(s) with comments: METATARSAL OSTEOTOMY LEFT 2ND (Left) - LEFT 2ND METATARSAL OPEN REDUCTION INTERNAL FIXATION (ORIF) METATARSAL (TOE) FRACTURE LEFT 4TH & 5TH (Left) - 4TH AND 5TH METATARSAL  Patient Location: PACU  Anesthesia Type: General, Regional  Level of Consciousness: awake, alert  and patient cooperative  Airway and Oxygen Therapy: Patient Spontanous Breathing and Patient connected to supplemental oxygen  Post-op Assessment: Post-op Vital signs reviewed, Patient's Cardiovascular Status Stable, Respiratory Function Stable, Patent Airway and No signs of Nausea or vomiting  Post-op Vital Signs: Reviewed and stable  Complications: No apparent anesthesia complications

## 2015-05-13 NOTE — Anesthesia Procedure Notes (Addendum)
Anesthesia Regional Block:  Popliteal block  Pre-Anesthetic Checklist: ,, timeout performed, Correct Patient, Correct Site, Correct Laterality, Correct Procedure, Correct Position, site marked, Risks and benefits discussed,  Surgical consent,  Pre-op evaluation,  At surgeon's request and post-op pain management  Laterality: Left  Prep: chloraprep       Needles:  Injection technique: Single-shot  Needle Type: Stimiplex     Needle Length: 9cm 9 cm Needle Gauge: 21 and 21 G    Additional Needles:  Procedures: ultrasound guided (picture in chart) and nerve stimulator Popliteal block  Nerve Stimulator or Paresthesia:  Response: ankle twitch, 0.5 mA,   Additional Responses:   Narrative:  Start time: 05/13/2015 8:36 AM End time: 05/13/2015 8:42 AM Injection made incrementally with aspirations every 5 mL.  Performed by: Personally  Anesthesiologist: Fidel Levy  Additional Notes: Functioning IV was confirmed and monitors applied. Ultrasound guidance: relevant anatomy identified, needle position confirmed, local anesthetic spread visualized around nerve(s)., vascular puncture avoided.  Image printed for medical record.  Negative aspiration and no paresthesias; incremental administration of local anesthetic. The patient tolerated the procedure well. Vitals signes recorded in RN notes.   Procedure Name: LMA Insertion Date/Time: 05/13/2015 9:48 AM Performed by: Mayme Genta Pre-anesthesia Checklist: Patient identified, Emergency Drugs available, Suction available, Timeout performed and Patient being monitored Patient Re-evaluated:Patient Re-evaluated prior to inductionOxygen Delivery Method: Circle system utilized Preoxygenation: Pre-oxygenation with 100% oxygen Intubation Type: IV induction LMA: LMA inserted LMA Size: 4.0 Number of attempts: 1 Placement Confirmation: positive ETCO2 and breath sounds checked- equal and bilateral Tube secured with: Tape

## 2015-05-13 NOTE — OR Nursing (Signed)
PATIENTS HEARING AIDS X 2 GIVEN TO PACU NURSE ASHLEY

## 2015-05-14 ENCOUNTER — Encounter: Payer: Self-pay | Admitting: Podiatry

## 2015-05-14 NOTE — Progress Notes (Signed)
Assisted Debabtrata Maji ANMD with left, popliteal block. Side rails up, monitors on throughout procedure. See vital signs in flow sheet. Tolerated Procedure well.  

## 2015-09-16 ENCOUNTER — Encounter: Admission: RE | Payer: Self-pay | Source: Ambulatory Visit

## 2015-09-16 ENCOUNTER — Ambulatory Visit: Admission: RE | Admit: 2015-09-16 | Payer: BLUE CROSS/BLUE SHIELD | Source: Ambulatory Visit | Admitting: Podiatry

## 2015-09-16 SURGERY — OSTEOTOMY, METATARSAL BONE
Anesthesia: General | Laterality: Right

## 2015-11-25 ENCOUNTER — Other Ambulatory Visit: Payer: Self-pay | Admitting: Obstetrics & Gynecology

## 2015-11-25 DIAGNOSIS — Z1239 Encounter for other screening for malignant neoplasm of breast: Secondary | ICD-10-CM

## 2015-12-07 ENCOUNTER — Ambulatory Visit
Admission: RE | Admit: 2015-12-07 | Discharge: 2015-12-07 | Disposition: A | Discharge status: Home / Self Care | Payer: BLUE CROSS/BLUE SHIELD | Source: Ambulatory Visit | Attending: Obstetrics & Gynecology | Admitting: Obstetrics & Gynecology

## 2015-12-07 ENCOUNTER — Other Ambulatory Visit: Payer: Self-pay | Admitting: Obstetrics & Gynecology

## 2015-12-07 DIAGNOSIS — Z1239 Encounter for other screening for malignant neoplasm of breast: Secondary | ICD-10-CM

## 2015-12-07 DIAGNOSIS — Z1231 Encounter for screening mammogram for malignant neoplasm of breast: Secondary | ICD-10-CM | POA: Diagnosis not present

## 2016-03-20 ENCOUNTER — Ambulatory Visit: Payer: BLUE CROSS/BLUE SHIELD | Attending: Unknown Physician Specialty

## 2017-03-06 ENCOUNTER — Other Ambulatory Visit: Payer: Self-pay | Admitting: Internal Medicine

## 2017-03-06 DIAGNOSIS — Z1231 Encounter for screening mammogram for malignant neoplasm of breast: Secondary | ICD-10-CM

## 2017-03-22 ENCOUNTER — Ambulatory Visit
Admission: RE | Admit: 2017-03-22 | Discharge: 2017-03-22 | Disposition: A | Discharge status: Home / Self Care | Payer: BLUE CROSS/BLUE SHIELD | Source: Ambulatory Visit | Attending: Internal Medicine | Admitting: Internal Medicine

## 2017-03-22 DIAGNOSIS — Z1231 Encounter for screening mammogram for malignant neoplasm of breast: Secondary | ICD-10-CM | POA: Insufficient documentation

## 2017-04-04 ENCOUNTER — Ambulatory Visit (INDEPENDENT_AMBULATORY_CARE_PROVIDER_SITE_OTHER): Payer: BLUE CROSS/BLUE SHIELD | Admitting: Certified Nurse Midwife

## 2017-04-04 ENCOUNTER — Encounter: Payer: Self-pay | Admitting: Certified Nurse Midwife

## 2017-04-04 VITALS — BP 112/62 | HR 61 | Ht 63.5 in | Wt 143.0 lb

## 2017-04-04 DIAGNOSIS — Z124 Encounter for screening for malignant neoplasm of cervix: Secondary | ICD-10-CM | POA: Diagnosis not present

## 2017-04-04 DIAGNOSIS — N952 Postmenopausal atrophic vaginitis: Secondary | ICD-10-CM | POA: Diagnosis not present

## 2017-04-04 DIAGNOSIS — Z01419 Encounter for gynecological examination (general) (routine) without abnormal findings: Secondary | ICD-10-CM | POA: Diagnosis not present

## 2017-04-04 MED ORDER — PRASTERONE 6.5 MG VA INST: 1.0000 | VAGINAL_INSERT | Freq: Every day | VAGINAL | 11 refills | Status: DC

## 2017-04-04 NOTE — Progress Notes (Signed)
Gynecology Annual Exam  PCP: Idelle Crouch, MD  Chief Complaint:  Chief Complaint  Patient presents with  . Gynecologic Exam    History of Present Illness:Carolyn Richard presents today for her annual exam. She is a 61 year old Caucasian/White female , G 2 P 2 0 0 2 , who is menopausal. . She isstill having problems with  vaginal dryness. She cannot take estrogen due to significant weight gain that happens in any form. She is s/p the ring, the cream, and the pill. She uses lubricants but is not satisfied.    Last Pap: 09/12/2014 Result: NORMAL, HPV Negative. Ever had a abnormal pap yes more than 20 years ago, and is s/p a CRYO  Last Mammogram: 03/22/2017 Result: Birads 1 Last Colonoscopy: 2015 - Normal  Last DEXA: 2018 Findings: osteopenia  Osteoporosis Prevention: Calcium and vitamin D, walking  Contraception Method: menopause  Her menses are absent - menopause since 1995  She has had no spotting.  The patient's past medical history is detailed in the past medical history section. hyperlipidemia, hypertension, anxiety/depression, psoriasis and thyroid mass. Since her last annual GYN exam 11/24/2015 , she had fractured two metatasals in her left foot while on a vacation in [place]. Had surgery when she returned to the [place]. Marland Kitchen She is sexually active and reports that she is experiencing. dryness.  There is no family history of breast cancer The patient does do occasional  self breast exams.  The patient drinks 2-3 glasses/week and does not smoke.      The patient denies current symptoms of depression.    Review of Systems: Review of Systems  Constitutional: Negative for chills, fever and weight loss.  HENT: Negative for congestion, sinus pain and sore throat.   Eyes: Negative for blurred vision and pain.  Respiratory: Negative for hemoptysis, shortness of breath and wheezing.   Cardiovascular: Negative for chest pain, palpitations and leg swelling.  Gastrointestinal: Negative for  abdominal pain, blood in stool, diarrhea, heartburn, nausea and vomiting.  Genitourinary: Negative for dysuria, frequency, hematuria and urgency.  Musculoskeletal: Negative for back pain, joint pain and myalgias.       Left foot pain due to fracture/surgery  Skin: Negative for itching and rash.  Neurological: Negative for dizziness, tingling and headaches.  Endo/Heme/Allergies: Negative for environmental allergies and polydipsia. Does not bruise/bleed easily.       Negative for hirsutism   Psychiatric/Behavioral: Negative for depression. The patient is not nervous/anxious and does not have insomnia.     Past Medical History:  Past Medical History:  Diagnosis Date  . Anxiety   . Complication of anesthesia    nausea  . Depression   . Dysrhythmia    PACs  . Headache    sinus - rare  . 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 Hyperlipidemia   . Hypertension   . Mitral valve prolapse   . Osteoarthritis    spine  . Scoliosis   . Thyroid mass   . Wears contact lenses    left eye  . Wears hearing aid    bilateral    Past Surgical History:  Past Surgical History:  Procedure Laterality Date  . CERVICAL FUSION     C3-4  . CESAREAN SECTION    . COLONOSCOPY  2015   small polyp found due again in 5 years  . CRYOTHERAPY     age 12  . METATARSAL OSTEOTOMY Left 05/13/2015  Procedure: METATARSAL OSTEOTOMY LEFT 2ND;  Surgeon: Albertine Patricia, DPM;  Location: [facility];  Service: Podiatry;  Laterality: Left;  LEFT 2ND METATARSAL  . ORIF TOE FRACTURE Left 05/13/2015   Procedure: OPEN REDUCTION INTERNAL FIXATION (ORIF) METATARSAL (TOE) FRACTURE LEFT 4TH & 5TH;  Surgeon: Albertine Patricia, DPM;  Location: [facility];  Service: Podiatry;  Laterality: Left;  4TH AND 5TH METATARSAL  . TONSILLECTOMY    . TYMPANOMASTOIDECTOMY Bilateral     Family History:  Family History  Problem Relation Age of Onset  . Hypertension Mother   . Pancreatic cancer Mother 41  .  Heart disease Father   . Hypertension Father   . Lymphoma Maternal Aunt   . Liver cancer Paternal Uncle   . Breast cancer Neg Hx     Social History:  Social History   Social History  . Marital status: Married    Spouse name: N/A  . Number of children: N/A  . Years of education: N/A   Occupational History  . Not on file.   Social History Main Topics  . Smoking status: Former Smoker    Quit date: 10/10/1999  . Smokeless tobacco: Never Used  . Alcohol use 1.8 oz/week    3 Glasses of wine per week  . Drug use: No  . Sexual activity: Yes    Birth control/ protection: Post-menopausal   Other Topics Concern  . Not on file   Social History Narrative  . No narrative on file    Allergies:  Allergies  Allergen Reactions  . Cashew Nut Oil Diarrhea    And nausea (all cashews)    Medications: Prior to Admission medications   Medication Sig Start Date End Date Taking? Authorizing Provider  ALPRAZolam (XANAX) 0.25 MG tablet Take 0.25 mg by mouth 2 (two) times daily.    [provider]  amLODipine (NORVASC) 5 MG tablet Take 5 mg by mouth daily. am    [provider]  aspirin 81 MG tablet Take 81 mg by mouth daily. PM    [provider]  B Complex-C (SUPER B COMPLEX PO) Take by mouth.    [provider]  buPROPion (ZYBAN) 150 MG 12 hr tablet Take 300 mg by mouth daily. AM    [provider]  calcium carbonate (TUMS - DOSED IN MG ELEMENTAL CALCIUM) 500 MG chewable tablet Chew 1 tablet by mouth daily.    [provider]  cetirizine (ZYRTEC) 10 MG tablet Take 10 mg by mouth daily as needed for allergies.    [provider]  Cholecalciferol (VITAMIN D-3 PO) Take by mouth.    [provider]  Chromium Picolinate (CHROMIUM PICOLATE PO) Take by mouth.    [provider]  Coenzyme Q10 (COQ10 PO) Take by mouth.    [provider]  fluticasone (FLONASE) 50 MCG/ACT nasal spray Place into both nostrils  daily. AM    [provider]  hydrochlorothiazide (HYDRODIURIL) 12.5 MG tablet Take 12.5 mg by mouth daily. AM    [provider]  naproxen sodium (ANAPROX) 220 MG tablet Take 440 mg by mouth daily. AM    [provider]  Omega-3 Fatty Acids (FISH OIL PO) Take by mouth.    [provider]  oxyCODONE-acetaminophen (PERCOCET/ROXICET) 5-325 MG per tablet Take by mouth every 4 (four) hours as needed for severe pain.    [provider]  simvastatin (ZOCOR) 10 MG tablet Take 10 mg by mouth daily. PM    [provider]  traZODone (DESYREL) 100 MG tablet Take 100 mg by mouth at bedtime.    [provider]    Physical Exam Vitals: There were no vitals taken for this visit.  General: NAD HEENT: normocephalic, anicteric Neck: no thyroid enlargement, no palpable nodules, no cervical lymphadenopathy  Pulmonary: No increased work of breathing, CTAB Cardiovascular: RRR, without murmur  Breast: Breast symmetrical, no tenderness, no palpable nodules or masses, no skin or nipple retraction present, no nipple discharge.  No axillary, infraclavicular or supraclavicular lymphadenopathy. Abdomen: Soft, non-tender, non-distended.  Umbilicus without lesions.  No hepatomegaly or masses palpable. No evidence of hernia. Genitourinary:  External: Normal external female genitalia.  Normal urethral meatus, normal Bartholin's and Skene's glands.    Vagina: Flattened rugae, no evidence of prolapse.    Cervix: Grossly normal in appearance, no bleeding, non-tender  Uterus: Retroverted, normal size, shape, and consistency, mobile, and non-tender  Adnexa: No adnexal masses, non-tender  Rectal: deferred  Lymphatic: no evidence of inguinal lymphadenopathy Extremities: no edema, erythema, or tenderness Neurologic: Grossly intact Psychiatric: mood appropriate, affect full     Assessment: 61 y.o. 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 Mammogram is up to date.  2) Cervical cancer screening - Pap was done. ASCCP guidelines and rational discussed.  Patient opts for yearly screening interval  3) Discussed treatment of vagal atrophy and dyspareunia with Intrarosa. Explained how to use and possible side effects. Two weeks of samples and a RX x1 year given.   4) Routine healthcare maintenance including cholesterol and diabetes screening managed by PCP and UTD  5) Colon cancer screening UTD.  6) Discussed calcium and vitamin D3 requirements  Dalia Heading, CNM

## 2017-04-10 ENCOUNTER — Encounter: Payer: Self-pay | Admitting: Certified Nurse Midwife

## 2017-04-10 DIAGNOSIS — I10 Essential (primary) hypertension: Secondary | ICD-10-CM | POA: Insufficient documentation

## 2017-04-10 DIAGNOSIS — M1990 Unspecified osteoarthritis, unspecified site: Secondary | ICD-10-CM | POA: Insufficient documentation

## 2017-04-10 DIAGNOSIS — F419 Anxiety disorder, unspecified: Secondary | ICD-10-CM | POA: Insufficient documentation

## 2017-04-10 DIAGNOSIS — F32A Depression, unspecified: Secondary | ICD-10-CM | POA: Insufficient documentation

## 2017-04-10 DIAGNOSIS — F329 Major depressive disorder, single episode, unspecified: Secondary | ICD-10-CM | POA: Insufficient documentation

## 2017-04-10 DIAGNOSIS — E785 Hyperlipidemia, unspecified: Secondary | ICD-10-CM | POA: Insufficient documentation

## 2017-04-10 LAB — IGP,CTNG,APTIMAHPV
Chlamydia, Nuc. Acid Amp: NEGATIVE
Gonococcus by Nucleic Acid Amp: NEGATIVE
HPV Aptima: NEGATIVE
PAP Smear Comment: 0

## 2017-07-02 ENCOUNTER — Other Ambulatory Visit: Payer: Self-pay | Admitting: Internal Medicine

## 2017-07-02 DIAGNOSIS — R0789 Other chest pain: Secondary | ICD-10-CM

## 2017-07-04 ENCOUNTER — Other Ambulatory Visit: Payer: Self-pay | Admitting: Internal Medicine

## 2017-07-04 DIAGNOSIS — R1084 Generalized abdominal pain: Secondary | ICD-10-CM

## 2017-07-09 ENCOUNTER — Ambulatory Visit: Payer: BLUE CROSS/BLUE SHIELD

## 2017-07-12 ENCOUNTER — Ambulatory Visit
Admission: RE | Admit: 2017-07-12 | Discharge: 2017-07-12 | Disposition: A | Discharge status: Home / Self Care | Payer: BLUE CROSS/BLUE SHIELD | Source: Ambulatory Visit | Attending: Internal Medicine | Admitting: Internal Medicine

## 2017-07-12 DIAGNOSIS — R0789 Other chest pain: Secondary | ICD-10-CM | POA: Diagnosis present

## 2017-07-12 DIAGNOSIS — M4184 Other forms of scoliosis, thoracic region: Secondary | ICD-10-CM | POA: Diagnosis not present

## 2017-07-12 DIAGNOSIS — I700 Atherosclerosis of aorta: Secondary | ICD-10-CM | POA: Insufficient documentation

## 2017-07-12 DIAGNOSIS — R1084 Generalized abdominal pain: Secondary | ICD-10-CM

## 2017-07-12 LAB — POCT I-STAT CREATININE: CREATININE: 0.8 mg/dL (ref 0.44–1.00)

## 2017-07-12 MED ORDER — IOPAMIDOL (ISOVUE-370) INJECTION 76%
75.0000 mL | Freq: Once | INTRAVENOUS | Status: AC | PRN
  Administered 2017-07-12: 75 mL via INTRAVENOUS

## 2018-04-24 ENCOUNTER — Other Ambulatory Visit: Payer: Self-pay | Admitting: Internal Medicine

## 2018-04-24 DIAGNOSIS — Z1231 Encounter for screening mammogram for malignant neoplasm of breast: Secondary | ICD-10-CM

## 2018-05-21 ENCOUNTER — Ambulatory Visit
Admission: RE | Admit: 2018-05-21 | Discharge: 2018-05-21 | Disposition: A | Discharge status: Home / Self Care | Payer: BLUE CROSS/BLUE SHIELD | Source: Ambulatory Visit | Attending: Internal Medicine | Admitting: Internal Medicine

## 2018-05-21 DIAGNOSIS — Z1231 Encounter for screening mammogram for malignant neoplasm of breast: Secondary | ICD-10-CM | POA: Insufficient documentation

## 2018-09-23 ENCOUNTER — Ambulatory Visit: Payer: Self-pay | Admitting: Certified Nurse Midwife

## 2018-09-23 ENCOUNTER — Encounter: Payer: Self-pay | Admitting: Certified Nurse Midwife

## 2018-09-23 ENCOUNTER — Ambulatory Visit (INDEPENDENT_AMBULATORY_CARE_PROVIDER_SITE_OTHER): Payer: BLUE CROSS/BLUE SHIELD | Admitting: Certified Nurse Midwife

## 2018-09-23 VITALS — BP 138/82 | Ht 63.0 in | Wt 139.0 lb

## 2018-09-23 DIAGNOSIS — M8580 Other specified disorders of bone density and structure, unspecified site: Secondary | ICD-10-CM | POA: Insufficient documentation

## 2018-09-23 DIAGNOSIS — M810 Age-related osteoporosis without current pathological fracture: Secondary | ICD-10-CM

## 2018-09-23 DIAGNOSIS — Z780 Asymptomatic menopausal state: Secondary | ICD-10-CM

## 2018-09-23 DIAGNOSIS — Z23 Encounter for immunization: Secondary | ICD-10-CM

## 2018-09-23 DIAGNOSIS — Z01419 Encounter for gynecological examination (general) (routine) without abnormal findings: Secondary | ICD-10-CM

## 2018-09-23 NOTE — Progress Notes (Signed)
Gynecology Annual Exam  PCP: Idelle Crouch, MD  Chief Complaint:  Chief Complaint  Patient presents with  . Gynecologic Exam    History of Present Illness:Carolyn Richard presents today for her annual exam. She is a 62 year old Caucasian/White female , G 2 P 2 0 0 2 , who is menopausal. . She is still having problems with  vaginal dryness. Lulu Riding after last annual with +/- results using it 2 x/week. May have been using it when having intercourse. She cannot take estrogen due to significant weight gain that happens in any form. She is also s/p the ring, the cream, and the pill. She has decided to just use  lubricants as she does not have frequent intercourse.  Last Pap: 04/04/2017 Result: NORMAL with reactive changes. Hx of abnormal pap: yes more than 20 years ago, and is s/p a CRYO  Last Mammogram: 05/22/2018 Result: Birads 1. The patient does do occasional  self breast exams. There is no family history of breast cancer  Last Colonoscopy: 2015 - Normal. Next due 2020 Last DEXA: 09/06/2017:  Findings: osteopenia (T scores -1.2 to -1.9) with Frax scores of 16%/1.6% Osteoporosis Prevention: Calcium and vitamin D, walking  Contraception Method: menopause  Her menses are absent - menopause since 1995  She has had no spotting.  The patient's past medical history is detailed in the past medical history section. hyperlipidemia, hypertension, anxiety/depression, psoriasis, osteopenia, fractured metatarsals, and thyroid mass. Since her last annual GYN exam 04/04/2017, she has had no other significant changes to her health.   The patient drinks 2-3 glasses/week and does not smoke.     Review of Systems: Review of Systems  Constitutional: Negative for chills, fever and weight loss.  HENT: Positive for congestion (sinus) and sore throat (sinus drainage). Negative for sinus pain.        Hearing changes  Eyes: Negative for blurred vision and pain.  Respiratory: Negative for hemoptysis,  shortness of breath and wheezing.   Cardiovascular: Negative for chest pain, palpitations and leg swelling.  Gastrointestinal: Negative for abdominal pain, blood in stool, diarrhea, heartburn, nausea and vomiting.  Genitourinary: Negative for dysuria, frequency, hematuria and urgency.  Musculoskeletal: Positive for joint pain. Negative for back pain and myalgias.       Ankle swelling  Skin: Negative for itching and rash.  Neurological: Positive for headaches (due to sinuses). Negative for dizziness and tingling.  Endo/Heme/Allergies: Positive for environmental allergies. Negative for polydipsia. Bruises/bleeds easily.       Negative for hirsutism Positive for hot flashes and heat intolerance  Psychiatric/Behavioral: Positive for depression. The patient is nervous/anxious. The patient does not have insomnia.     Past Medical History:  Past Medical History:  Diagnosis Date  . Anxiety   . Complication of anesthesia    nausea  . Depression   . Dysrhythmia    PACs  . Headache    sinus - rare  . 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 Hyperlipidemia   . Hypertension   . Mitral valve prolapse   . Osteoarthritis    spine  . Scoliosis   . Thyroid mass   . Wears contact lenses    left eye  . Wears hearing aid    bilateral    Past Surgical History:  Past Surgical History:  Procedure Laterality Date  . CERVICAL FUSION     C3-4  . CESAREAN SECTION    . COLONOSCOPY  2015   small polyp found due again in 5 years  . CRYOTHERAPY     age 41  . METATARSAL OSTEOTOMY Left 05/13/2015   Procedure: METATARSAL OSTEOTOMY LEFT 2ND;  Surgeon: Albertine Patricia, DPM;  Location: [facility];  Service: Podiatry;  Laterality: Left;  LEFT 2ND METATARSAL  . ORIF TOE FRACTURE Left 05/13/2015   Procedure: OPEN REDUCTION INTERNAL FIXATION (ORIF) METATARSAL (TOE) FRACTURE LEFT 4TH & 5TH;  Surgeon: Albertine Patricia, DPM;  Location: [facility];  Service: Podiatry;  Laterality: Left;   4TH AND 5TH METATARSAL  . TONSILLECTOMY    . TYMPANOMASTOIDECTOMY Bilateral     Family History:  Family History  Problem Relation Age of Onset  . Hypertension Mother   . Pancreatic cancer Mother 38  . Heart disease Father   . Hypertension Father   . Lymphoma Maternal Aunt   . Liver cancer Paternal Uncle   . Breast cancer Neg Hx     Social History:  Social History   Socioeconomic History  . Marital status: Married    Spouse name: Not on file  . Number of children: 2  . Years of education: Not on file  . Highest education level: Not on file  Occupational History  . Occupation: Retired  Scientific laboratory technician  . Financial resource strain: Not on file  . Food insecurity:    Worry: Not on file    Inability: Not on file  . Transportation needs:    Medical: Not on file    Non-medical: Not on file  Tobacco Use  . Smoking status: Former Smoker    Last attempt to quit: 10/10/1999    Years since quitting: 18.9  . Smokeless tobacco: Never Used  Substance and Sexual Activity  . Alcohol use: Yes    Alcohol/week: 3.0 standard drinks    Types: 3 Glasses of wine per week  . Drug use: No  . Sexual activity: Yes    Birth control/protection: Post-menopausal  Lifestyle  . Physical activity:    Days per week: Not on file    Minutes per session: Not on file  . Stress: Not on file  Relationships  . Social connections:    Talks on phone: Not on file    Gets together: Not on file    Attends religious service: Not on file    Active member of club or organization: Not on file    Attends meetings of clubs or organizations: Not on file    Relationship status: Not on file  . Intimate partner violence:    Fear of current or ex partner: Not on file    Emotionally abused: Not on file    Physically abused: Not on file    Forced sexual activity: Not on file  Other Topics Concern  . Not on file  Social History Narrative  . Not on file    Allergies:  Allergies  Allergen Reactions  . Cashew  Nut Oil Diarrhea    And nausea (all cashews)    Medications:  Current Outpatient Medications on File Prior to Visit  Medication Sig Dispense Refill  . ALPRAZolam (XANAX) 0.25 MG tablet Take 0.25 mg by mouth 2 (two) times daily.    Marland Kitchen amLODipine (NORVASC) 5 MG tablet Take 5 mg by mouth daily. am    . aspirin 81 MG tablet Take 81 mg by mouth daily. PM    . B Complex-C (SUPER B COMPLEX PO) Take by mouth.    . Biotin 1000 MCG tablet  Take by mouth.    Marland Kitchen buPROPion (WELLBUTRIN XL) 150 MG 24 hr tablet   1  . calcium carbonate (TUMS - DOSED IN MG ELEMENTAL CALCIUM) 500 MG chewable tablet Chew 1 tablet by mouth daily.    . cetirizine (ZYRTEC) 10 MG tablet Take 10 mg by mouth daily as needed for allergies.    . Cholecalciferol (VITAMIN D-3 PO) Take 2,000 Units by mouth daily.     . Chromium Picolinate (CHROMIUM PICOLATE PO) Take by mouth.    . Coenzyme Q10 (COQ10 PO) Take by mouth.    . fluticasone (FLONASE) 50 MCG/ACT nasal spray Place into both nostrils daily. AM    . hydrochlorothiazide (HYDRODIURIL) 12.5 MG tablet Take 12.5 mg by mouth daily. AM    . naproxen sodium (ANAPROX) 220 MG tablet Take 440 mg by mouth daily. AM    . Omega-3 Fatty Acids (FISH OIL PO) Take by mouth.    . Prasterone (INTRAROSA) 6.5 MG INST Place 1 suppository vaginally at bedtime. 30 each 11  . simvastatin (ZOCOR) 10 MG tablet Take 10 mg by mouth daily. PM    . traZODone (DESYREL) 100 MG tablet Take 100 mg by mouth at bedtime.     No current facility-administered medications on file prior to visit.    Physical Exam Vitals: BP 138/82 (BP Location: Left Arm, Patient Position: Sitting, Cuff Size: Normal)   Ht 5\' 3"  (1.6 m)   Wt 139 lb (63 kg)   BMI 24.62 kg/m   General: WF in  NAD HEENT: normocephalic, anicteric Neck: no thyroid enlargement, no palpable nodules, no cervical lymphadenopathy  Pulmonary: No increased work of breathing, CTAB Cardiovascular: RRR, without murmur  Breast: Breast symmetrical, no  tenderness, no palpable nodules or masses, no skin or nipple retraction present, no nipple discharge.  No axillary, infraclavicular or supraclavicular lymphadenopathy. Abdomen: Soft, non-tender, non-distended.  Umbilicus without lesions.  No hepatomegaly or masses palpable. No evidence of hernia. Genitourinary:  External: Normal external female genitalia.  Normal urethral meatus, normal Bartholin's and Skene's glands.    Vagina: Flattened rugae, no masses   Cervix: no bleeding, non-tender  Uterus: Retroverted, normal size, shape, and consistency, mobile, and non-tender  Adnexa: No adnexal masses, non-tender  Rectal: deferred  Lymphatic: no evidence of inguinal lymphadenopathy Extremities: no edema, erythema, or tenderness Neurologic: Grossly intact Psychiatric: mood appropriate, affect full     Assessment: 62 y.o. postmenopausal female -annual gyn exam Vaginal atrophy due to menopause  Plan:    1) Breast cancer screening - recommend monthly self breast exam. Mammogram is up to date.  2) Cervical cancer screening - Pap not done. ASCCP guidelines and rational discussed.  Patient opts for every 2 year  screening interval  3) Routine healthcare maintenance including cholesterol and diabetes screening managed by PCP and UTD. Flu vaccine today.  4) Colon cancer screening -colonoscopy 2015, next due next year.  5) Discussed calcium and vitamin D3 requirements. Explained role of exercise in preventing osteoporosis  6) RTO 1 year and prn.  Dalia Heading, CNM

## 2018-10-17 ENCOUNTER — Other Ambulatory Visit: Payer: Self-pay | Admitting: Internal Medicine

## 2018-10-17 ENCOUNTER — Encounter: Payer: Self-pay | Admitting: *Deleted

## 2018-10-17 ENCOUNTER — Telehealth: Payer: Self-pay | Admitting: *Deleted

## 2018-10-17 DIAGNOSIS — Z122 Encounter for screening for malignant neoplasm of respiratory organs: Secondary | ICD-10-CM

## 2018-10-17 DIAGNOSIS — R1084 Generalized abdominal pain: Secondary | ICD-10-CM

## 2018-10-17 NOTE — Telephone Encounter (Signed)
Received a referral for initial lung cancer screening scan.  Contacted the patient and obtained their smoking history, former smoker quit 2006 with a 38.75pkyr history  as well as answering questions related to screening process.  Patient denies signs of lung cancer such as weight loss or hemoptysis at this time.  Patient denies comorbidity that would prevent curative treatment if lung cancer were found.  Patient is scheduled for the Shared Decision Making Visit and CT scan on 10-29-2018 @1330 .

## 2018-10-25 ENCOUNTER — Ambulatory Visit
Admission: RE | Admit: 2018-10-25 | Discharge: 2018-10-25 | Disposition: A | Discharge status: Home / Self Care | Payer: BLUE CROSS/BLUE SHIELD | Source: Ambulatory Visit | Attending: Internal Medicine | Admitting: Internal Medicine

## 2018-10-25 ENCOUNTER — Ambulatory Visit: Payer: BLUE CROSS/BLUE SHIELD

## 2018-10-25 DIAGNOSIS — R1084 Generalized abdominal pain: Secondary | ICD-10-CM | POA: Insufficient documentation

## 2018-10-28 ENCOUNTER — Ambulatory Visit
Admission: RE | Admit: 2018-10-28 | Discharge: 2018-10-28 | Disposition: A | Discharge status: Home / Self Care | Payer: BLUE CROSS/BLUE SHIELD | Source: Ambulatory Visit | Attending: Oncology | Admitting: Oncology

## 2018-10-28 ENCOUNTER — Encounter: Payer: Self-pay | Admitting: *Deleted

## 2018-10-28 ENCOUNTER — Inpatient Hospital Stay: Payer: BLUE CROSS/BLUE SHIELD | Attending: Oncology | Admitting: Oncology

## 2018-10-28 DIAGNOSIS — Z122 Encounter for screening for malignant neoplasm of respiratory organs: Secondary | ICD-10-CM | POA: Diagnosis present

## 2018-10-28 DIAGNOSIS — Z87891 Personal history of nicotine dependence: Secondary | ICD-10-CM

## 2018-10-28 DIAGNOSIS — Z716 Tobacco abuse counseling: Secondary | ICD-10-CM

## 2018-10-28 NOTE — Progress Notes (Signed)
In accordance with CMS guidelines, patient has met eligibility criteria including age, absence of signs or symptoms of lung cancer.  Social History   Tobacco Use  . Smoking status: Former Smoker    Packs/day: 1.25    Years: 31.00    Pack years: 38.75    Types: Cigarettes    Last attempt to quit: 10/09/2004    Years since quitting: 14.0  . Smokeless tobacco: Never Used  Substance Use Topics  . Alcohol use: Yes    Alcohol/week: 3.0 standard drinks    Types: 3 Glasses of wine per week  . Drug use: No     A shared decision-making session was conducted prior to the performance of CT scan. This includes one or more decision aids, includes benefits and harms of screening, follow-up diagnostic testing, over-diagnosis, false positive rate, and total radiation exposure.  Counseling on the importance of adherence to annual lung cancer LDCT screening, impact of co-morbidities, and ability or willingness to undergo diagnosis and treatment is imperative for compliance of the program.  Counseling on the importance of continued smoking cessation for former smokers; the importance of smoking cessation for current smokers, and information about tobacco cessation interventions have been given to patient including [place] and 1800 quit [place] programs.  Written order for lung cancer screening with LDCT has been given to the patient and any and all questions have been answered to the best of my abilities.   Yearly follow up will be coordinated by Burgess Estelle, Thoracic Navigator.  Faythe Casa, NP 10/28/2018 10:58 AM

## 2018-10-29 ENCOUNTER — Ambulatory Visit: Payer: BLUE CROSS/BLUE SHIELD | Admitting: Oncology

## 2019-10-20 ENCOUNTER — Other Ambulatory Visit: Payer: Self-pay | Admitting: Internal Medicine

## 2019-10-20 DIAGNOSIS — Z1231 Encounter for screening mammogram for malignant neoplasm of breast: Secondary | ICD-10-CM

## 2019-10-22 ENCOUNTER — Telehealth: Payer: Self-pay

## 2019-10-22 NOTE — Telephone Encounter (Signed)
Patient has been notified that lung cancer screening CT scan is due currently or will be in near future. Confirmed that patient is within the appropriate age range, and asymptomatic, (no signs or symptoms of lung cancer). Patient denies illness that would prevent curative treatment for lung cancer if found. Verified smoking history (quit in 2006) Patient is agreeable for CT scan being scheduled any time day. But she would like to wait until she gets her covid-19 vaccine and states that she will give Shawn a call when that happens and she is ready to schedule. Gave patient call back number.

## 2019-11-09 ENCOUNTER — Ambulatory Visit: Payer: BLUE CROSS/BLUE SHIELD

## 2019-11-20 ENCOUNTER — Ambulatory Visit: Payer: BLUE CROSS/BLUE SHIELD

## 2020-01-06 ENCOUNTER — Telehealth: Payer: Self-pay

## 2020-01-06 NOTE — Telephone Encounter (Signed)
Message left notifying patient that it is time to schedule the low dose lung cancer screening CT scan.  Instructed patient to return call to Shawn Perkins at [phone] to verify information prior to CT scan being scheduled.    

## 2020-01-20 ENCOUNTER — Other Ambulatory Visit: Payer: Self-pay

## 2020-01-20 ENCOUNTER — Ambulatory Visit (INDEPENDENT_AMBULATORY_CARE_PROVIDER_SITE_OTHER): Payer: Medicare Other | Admitting: Dermatology

## 2020-01-20 DIAGNOSIS — L219 Seborrheic dermatitis, unspecified: Secondary | ICD-10-CM | POA: Diagnosis not present

## 2020-01-20 DIAGNOSIS — D2272 Melanocytic nevi of left lower limb, including hip: Secondary | ICD-10-CM

## 2020-01-20 DIAGNOSIS — L578 Other skin changes due to chronic exposure to nonionizing radiation: Secondary | ICD-10-CM

## 2020-01-20 DIAGNOSIS — L820 Inflamed seborrheic keratosis: Secondary | ICD-10-CM

## 2020-01-20 DIAGNOSIS — D225 Melanocytic nevi of trunk: Secondary | ICD-10-CM

## 2020-01-20 DIAGNOSIS — L814 Other melanin hyperpigmentation: Secondary | ICD-10-CM

## 2020-01-20 DIAGNOSIS — L821 Other seborrheic keratosis: Secondary | ICD-10-CM

## 2020-01-20 DIAGNOSIS — Z1283 Encounter for screening for malignant neoplasm of skin: Secondary | ICD-10-CM

## 2020-01-20 DIAGNOSIS — D229 Melanocytic nevi, unspecified: Secondary | ICD-10-CM

## 2020-01-20 DIAGNOSIS — L905 Scar conditions and fibrosis of skin: Secondary | ICD-10-CM

## 2020-01-20 NOTE — Progress Notes (Signed)
   Follow-Up Visit   Subjective  Carolyn Richard is a 64 y.o. female who presents for the following: Annual Exam.  Patient has a spot on abdomen that crusts over, gets irritated and may be new. Check dark spot on left chest, has gotten larger. No history of skin cancer or dysplastic nevi.  The following portions of the chart were reviewed this encounter and updated as appropriate:     Review of Systems: No other skin or systemic complaints.  Objective  Well appearing patient in no apparent distress; mood and affect are within normal limits.  A full examination was performed including scalp, head, eyes, ears, nose, lips, neck, chest, axillae, abdomen, back, buttocks, bilateral upper extremities, bilateral lower extremities, hands, feet, fingers, toes, fingernails, and toenails. All findings within normal limits unless otherwise noted below.  Objective  Left upper chest x 1, R upper abdomen x 1 (2): Waxy tan patch with erythema chest Erythematous keratotic or waxy stuck-on papule abdomen.   Objective  Left lower abdomen: 53mm brown macule, slight irregular border with adjacent 35mm light brown macule abdomen  Left Upper Knee: 18mm flesh papule superior brown edge  Objective  Right Mid Back: White scar with speckled pigmentation, h/o benign mole removal  Objective  eyebrows, forehead, scalp: History of pink patches with greasy scale.   Assessment & Plan  Inflamed seborrheic keratosis (2) Left upper chest x 1, R upper abdomen x 1  Destruction of lesion - Left upper chest x 1, R upper abdomen x 1  Destruction method: cryotherapy   Informed consent: discussed and consent obtained   Lesion destroyed using liquid nitrogen: Yes   Region frozen until ice ball extended beyond lesion: Yes   Outcome: patient tolerated procedure well with no complications   Post-procedure details: wound care instructions given    Nevus (2) Left Upper Knee; Left lower abdomen  Benign-appearing.   Observation.  Call clinic for new or changing moles.  Recommend daily use of broad spectrum spf 30+ sunscreen to sun-exposed areas.    Scar Right Mid Back  Benign-appearing, observe. With probable recurrence benign nevus    Seborrheic dermatitis eyebrows, forehead, scalp  Cont fluocinolone 0.1% solution daily PRN. Pt does not need refills at this time.  Cont T-Sal PRN   Skin cancer screening performed today.  Actinic Damage - diffuse scaly erythematous macules with underlying dyspigmentation - Recommend daily broad spectrum sunscreen SPF 30+ to sun-exposed areas, reapply every 2 hours as needed.  - Call for new or changing lesions.  Melanocytic Nevi - Tan-brown and/or pink-flesh-colored symmetric macules and papules - Benign appearing on exam today - Observation - Call clinic for new or changing moles - Recommend daily use of broad spectrum spf 30+ sunscreen to sun-exposed areas.   Seborrheic Keratoses - Stuck-on, waxy, tan-brown papules and plaques  - Discussed benign etiology and prognosis. - Observe - Call for any changes  Lentigines - Scattered tan macules  - Discussed due to sun exposure - Benign, observe - Call for any changes  Return in about 1 year (around 01/19/2021) for TBSE.   Graciella Belton, RMA, am acting as scribe for Brendolyn Patty, MD .

## 2020-01-20 NOTE — Patient Instructions (Addendum)
Cryotherapy Aftercare  . Wash gently with soap and water everyday.   . Apply Vaseline and Band-Aid daily until healed. Recommend daily broad spectrum sunscreen SPF 30+ to sun-exposed areas, reapply every 2 hours as needed. Call for new or changing lesions.  

## 2020-01-23 ENCOUNTER — Telehealth: Payer: Self-pay

## 2020-01-23 NOTE — Telephone Encounter (Signed)
Message left notifying patient that it is time to schedule the low dose lung cancer screening CT scan.  Instructed patient to return call to Shawn Perkins at [phone] to verify information prior to CT scan being scheduled.    

## 2020-03-26 ENCOUNTER — Telehealth: Payer: Self-pay | Admitting: *Deleted

## 2020-03-26 DIAGNOSIS — Z122 Encounter for screening for malignant neoplasm of respiratory organs: Secondary | ICD-10-CM

## 2020-03-26 DIAGNOSIS — Z87891 Personal history of nicotine dependence: Secondary | ICD-10-CM

## 2020-03-26 NOTE — Telephone Encounter (Signed)
(  03/26/2020) Pt has been notified that lung cancer screening CT scan is due currently or will be in near future. Confirmed pt is within appropriate age range, and asymptomatic. Pt denies illness that would prevent curative treatment for lung cancer if found. Verified smoking history (Former Smoker since 2006, 1.25 ppd). Pt did/will receive 2nd COVID VX on (approx. 12/08/19) [CT to be scheduled approx. 4 weeks after vx date] Pt is agreeable for CT scan being scheduled, prefers an appt on a Monday, Tuesday, or Wednesday; no time preference. SRW

## 2020-03-29 NOTE — Addendum Note (Signed)
Addended by: Lieutenant Diego on: 03/29/2020 03:16 PM   Modules accepted: Orders

## 2020-03-29 NOTE — Telephone Encounter (Signed)
Smoking history: former, quit 2006 38.75pkyr

## 2020-04-07 ENCOUNTER — Ambulatory Visit
Admission: RE | Admit: 2020-04-07 | Discharge: 2020-04-07 | Disposition: A | Discharge status: Home / Self Care | Payer: Medicare Other | Source: Ambulatory Visit | Attending: Oncology | Admitting: Oncology

## 2020-04-07 ENCOUNTER — Other Ambulatory Visit: Payer: Self-pay

## 2020-04-07 DIAGNOSIS — Z87891 Personal history of nicotine dependence: Secondary | ICD-10-CM | POA: Insufficient documentation

## 2020-04-07 DIAGNOSIS — Z122 Encounter for screening for malignant neoplasm of respiratory organs: Secondary | ICD-10-CM | POA: Diagnosis present

## 2020-04-13 ENCOUNTER — Telehealth: Payer: Self-pay | Admitting: *Deleted

## 2020-04-13 NOTE — Telephone Encounter (Signed)
Notified patient of LDCT lung cancer screening program results with recommendation for 6 month follow up imaging. Also notified of incidental findings noted below and is encouraged to discuss further with PCP who will receive a copy of this note and/or the CT report. Patient verbalizes understanding.   : 1. Lung-RADS 3, probably benign findings. Short-term follow-up in 6 months is recommended with repeat low-dose chest CT without contrast (please use the following order, "CT CHEST LCS NODULE FOLLOW-UP W/O CM"). 2. Aortic Atherosclerosis (ICD10-I70.0) and Emphysema (ICD10-J43.9).

## 2020-09-27 ENCOUNTER — Other Ambulatory Visit: Payer: Self-pay | Admitting: *Deleted

## 2020-09-27 DIAGNOSIS — Z87891 Personal history of nicotine dependence: Secondary | ICD-10-CM

## 2020-09-27 DIAGNOSIS — R918 Other nonspecific abnormal finding of lung field: Secondary | ICD-10-CM

## 2020-09-27 NOTE — Progress Notes (Signed)
Contacted and scheduled for LCS Nodule follow up scan. Patient is former smoker, quit 15 years ago with 38.75 pack year history

## 2020-10-11 ENCOUNTER — Ambulatory Visit: Admission: RE | Admit: 2020-10-11 | Payer: Medicare Other | Source: Ambulatory Visit

## 2020-10-21 ENCOUNTER — Other Ambulatory Visit: Payer: Self-pay

## 2020-10-21 ENCOUNTER — Ambulatory Visit
Admission: RE | Admit: 2020-10-21 | Discharge: 2020-10-21 | Disposition: A | Discharge status: Home / Self Care | Payer: Medicare Other | Source: Ambulatory Visit | Attending: Oncology | Admitting: Oncology

## 2020-10-21 DIAGNOSIS — Z87891 Personal history of nicotine dependence: Secondary | ICD-10-CM | POA: Diagnosis present

## 2020-10-21 DIAGNOSIS — R918 Other nonspecific abnormal finding of lung field: Secondary | ICD-10-CM | POA: Diagnosis present

## 2020-10-27 ENCOUNTER — Encounter: Payer: Self-pay | Admitting: *Deleted

## 2021-01-17 ENCOUNTER — Encounter: Payer: Medicare Other | Admitting: Dermatology

## 2021-01-31 ENCOUNTER — Other Ambulatory Visit: Payer: Self-pay

## 2021-01-31 ENCOUNTER — Ambulatory Visit (INDEPENDENT_AMBULATORY_CARE_PROVIDER_SITE_OTHER): Payer: Medicare Other | Admitting: Dermatology

## 2021-01-31 DIAGNOSIS — L219 Seborrheic dermatitis, unspecified: Secondary | ICD-10-CM

## 2021-01-31 DIAGNOSIS — D225 Melanocytic nevi of trunk: Secondary | ICD-10-CM

## 2021-01-31 DIAGNOSIS — L905 Scar conditions and fibrosis of skin: Secondary | ICD-10-CM

## 2021-01-31 DIAGNOSIS — D1800 Hemangioma unspecified site: Secondary | ICD-10-CM

## 2021-01-31 DIAGNOSIS — D2272 Melanocytic nevi of left lower limb, including hip: Secondary | ICD-10-CM

## 2021-01-31 DIAGNOSIS — Z1283 Encounter for screening for malignant neoplasm of skin: Secondary | ICD-10-CM | POA: Diagnosis not present

## 2021-01-31 DIAGNOSIS — L821 Other seborrheic keratosis: Secondary | ICD-10-CM

## 2021-01-31 DIAGNOSIS — L300 Nummular dermatitis: Secondary | ICD-10-CM

## 2021-01-31 DIAGNOSIS — L720 Epidermal cyst: Secondary | ICD-10-CM

## 2021-01-31 DIAGNOSIS — D229 Melanocytic nevi, unspecified: Secondary | ICD-10-CM

## 2021-01-31 DIAGNOSIS — L578 Other skin changes due to chronic exposure to nonionizing radiation: Secondary | ICD-10-CM

## 2021-01-31 DIAGNOSIS — L814 Other melanin hyperpigmentation: Secondary | ICD-10-CM

## 2021-01-31 MED ORDER — TRETINOIN 0.1 % EX CREA: TOPICAL_CREAM | Freq: Every day | CUTANEOUS | 4 refills | Status: DC

## 2021-01-31 MED ORDER — KETOCONAZOLE 2 % EX CREA: 1.0000 "application " | TOPICAL_CREAM | Freq: Every day | CUTANEOUS | 4 refills | Status: AC

## 2021-01-31 MED ORDER — MOMETASONE FUROATE 0.1 % EX CREA: 1.0000 "application " | TOPICAL_CREAM | CUTANEOUS | 1 refills | Status: DC

## 2021-01-31 NOTE — Progress Notes (Signed)
Follow-Up Visit   Subjective  Carolyn Richard is a 65 y.o. female who presents for the following: Total body skin exam (No hx od), Seborrheic Dermatitis (Eyebrows, forehead, scalp, fluocinoline sol expensive), and Rash (Neck, 2-57m, dry, not itchy, moisturizer).   The following portions of the chart were reviewed this encounter and updated as appropriate:       Review of Systems:  No other skin or systemic complaints except as noted in HPI or Assessment and Plan.  Objective  Well appearing patient in no apparent distress; mood and affect are within normal limits.  A full examination was performed including scalp, head, eyes, ears, nose, lips, neck, chest, axillae, abdomen, back, buttocks, bilateral upper extremities, bilateral lower extremities, hands, feet, fingers, toes, fingernails, and toenails. All findings within normal limits unless otherwise noted below.  Objective  forehead, scalp, eyebrows: Erythema/xerosis upper forehead  Objective  Neck - Anterior: Pink scaly patch  Objective  Neck - Posterior: Pink scaly papule with surrounding eythema  Left upper knee; Left lower abdomen  Objective  Left lower abdomen: 5.45mm brown macule, slight irregular border with adjacent 67mm light brown macule  Left upper knee: 4.84mm flesh pap superior brown edge  Objective  Right mid back: Scar with speckled repigmentation   Assessment & Plan    Lentigines - Scattered tan macules - Due to sun exposure - Benign-appering, observe - Recommend daily broad spectrum sunscreen SPF 30+ to sun-exposed areas, reapply every 2 hours as needed. - Call for any changes  Seborrheic Keratoses - Stuck-on, waxy, tan-brown papules and/or plaques  - Benign-appearing - Discussed benign etiology and prognosis. - Observe - Call for any changes  Melanocytic Nevi - Tan-brown and/or pink-flesh-colored symmetric macules and papules - Benign appearing on exam today - Observation - Call clinic  for new or changing moles - Recommend daily use of broad spectrum spf 30+ sunscreen to sun-exposed areas.   Hemangiomas - Red papules - Discussed benign nature - Observe - Call for any changes  Actinic Damage - Chronic condition, secondary to cumulative UV/sun exposure - diffuse scaly erythematous macules with underlying dyspigmentation - Recommend daily broad spectrum sunscreen SPF 30+ to sun-exposed areas, reapply every 2 hours as needed.  - Staying in the shade or wearing long sleeves, sun glasses (UVA+UVB protection) and wide brim hats (4-inch brim around the entire circumference of the hat) are also recommended for sun protection.  - Call for new or changing lesions.  Skin cancer screening performed today.   Seborrheic dermatitis forehead, scalp, eyebrows  Seborrheic Dermatitis  -  is a chronic persistent rash characterized by pinkness and scaling most commonly of the mid face but also can occur on the scalp (dandruff), ears; mid chest and mid back. It tends to be exacerbated by stress and cooler weather.  People who have neurologic disease may experience new onset or exacerbation of existing seborrheic dermatitis.  The condition is not curable but treatable and can be controlled.   Start Ketoconazole 2% cr qhs to aa face, eyebrows, scalp Start Mometasone cr qd up to 4d/wk aa forehead, eyebrows and scalp prn flares  ketoconazole (NIZORAL) 2 % cream - forehead, scalp, eyebrows  Nummular dermatitis Neck - Anterior  Start Mometasone cr qd/bid until clear, then prn flares  Recommend mild soap and moisturizing cream 1-2 times daily.  Reordered Medications mometasone (ELOCON) 0.1 % cream  Epidermal cyst Neck - Posterior  Benign, recently inflamed  Cont Tretinoin 0.1% cr qhs Start Mometasone cr qd/bid aa prn  flares  Discussed surgical excision to remove if symptoms worsen, discussed resulting scar  tretinoin (RETIN-A) 0.1 % cream - Neck - Posterior  Nevus (2) Left  upper knee; Left lower abdomen  Benign-appearing.  Observation.  Call clinic for new or changing moles.  Recommend daily use of broad spectrum spf 30+ sunscreen to sun-exposed areas.    Scar Right mid back  With recurrent nevus.  Benign-appearing.  Observation.  Call clinic for new or changing moles.  Recommend daily use of broad spectrum spf 30+ sunscreen to sun-exposed areas.    Return in about 1 year (around 01/31/2022) for TBSE.  I, Othelia Pulling, RMA, am acting as scribe for Brendolyn Patty, MD . Documentation: I have reviewed the above documentation for accuracy and completeness, and I agree with the above.  Brendolyn Patty MD

## 2021-01-31 NOTE — Patient Instructions (Addendum)
If you have any questions or concerns for your doctor, please call our main line at [phone] and press option 4 to reach your doctor's medical assistant. If no one answers, please leave a voicemail as directed and we will return your call as soon as possible. Messages left after 4 pm will be answered the following business day.   You may also send [place] a message via [place]. We typically respond to MyChart messages within 1-2 business days.  For prescription refills, please ask your pharmacy to contact our office. Our fax number is [phone].  If you have an urgent issue when the clinic is closed that cannot wait until the next business day, you can page your doctor at the number below.    Please note that while we do our best to be available for urgent issues outside of office hours, we are not available 24/7.   If you have an urgent issue and are unable to reach [place], you may choose to seek medical care at your doctor's office, retail clinic, urgent care center, or emergency room.  If you have a medical emergency, please immediately call 911 or go to the emergency department.  Pager Numbers  - Dr. Nehemiah Massed: [phone]  - Dr. Laurence Ferrari: [phone]  - Dr. Nicole Kindred: [phone]  In the event of inclement weather, please call our main line at [phone] for an update on the status of any delays or closures.  Dermatology Medication Tips: Please keep the boxes that topical medications come in in order to help keep track of the instructions about where and how to use these. Pharmacies typically print the medication instructions only on the boxes and not directly on the medication tubes.   If your medication is too expensive, please contact our office at [phone] option 4 or send [place] a message through [place].   We are unable to tell what your co-pay for medications will be in advance as this is different depending on your insurance coverage. However, we may be able to find a substitute  medication at lower cost or fill out paperwork to get insurance to cover a needed medication.   If a prior authorization is required to get your medication covered by your insurance company, please allow [place] 1-2 business days to complete this process.  Drug prices often vary depending on where the prescription is filled and some pharmacies may offer cheaper prices.  The website www.goodrx.com contains coupons for medications through different pharmacies. The prices here do not account for what the cost may be with help from insurance (it may be cheaper with your insurance), but the website can give you the price if you did not use any insurance.  - You can print the associated coupon and take it with your prescription to the pharmacy.  - You may also stop by our office during regular business hours and pick up a GoodRx coupon card.  - If you need your prescription sent electronically to a different pharmacy, notify our office through [facility] or by phone at [phone] option 4.   Mometasone cream- For face/scalp - daily up to 4 days a week when flared to forehead, eyebrows and scalp For neck - one to two times a day until clear, then as needed for flares For posterior neck cyst - one to two times a day as needed when flared  Ketoconazole Cream Apply to dry scaly areas on forehead, eyebrows and in scalp nightly  Tretinoin 0.1% cream Apply to cyst on neck  nightly

## 2021-02-28 ENCOUNTER — Other Ambulatory Visit: Payer: Self-pay | Admitting: Internal Medicine

## 2021-02-28 DIAGNOSIS — Z1231 Encounter for screening mammogram for malignant neoplasm of breast: Secondary | ICD-10-CM

## 2021-12-06 ENCOUNTER — Other Ambulatory Visit: Payer: Self-pay | Admitting: *Deleted

## 2021-12-06 DIAGNOSIS — Z87891 Personal history of nicotine dependence: Secondary | ICD-10-CM

## 2021-12-08 ENCOUNTER — Other Ambulatory Visit: Payer: Self-pay

## 2021-12-08 ENCOUNTER — Ambulatory Visit
Admission: RE | Admit: 2021-12-08 | Discharge: 2021-12-08 | Disposition: A | Discharge status: Home / Self Care | Payer: Medicare Other | Source: Ambulatory Visit | Attending: Internal Medicine | Admitting: Internal Medicine

## 2021-12-08 DIAGNOSIS — Z1231 Encounter for screening mammogram for malignant neoplasm of breast: Secondary | ICD-10-CM | POA: Insufficient documentation

## 2022-01-16 ENCOUNTER — Ambulatory Visit
Admission: RE | Admit: 2022-01-16 | Discharge: 2022-01-16 | Disposition: A | Discharge status: Home / Self Care | Payer: Medicare Other | Source: Ambulatory Visit | Attending: Acute Care | Admitting: Acute Care

## 2022-01-16 DIAGNOSIS — Z87891 Personal history of nicotine dependence: Secondary | ICD-10-CM | POA: Insufficient documentation

## 2022-01-17 ENCOUNTER — Telehealth: Payer: Self-pay | Admitting: Acute Care

## 2022-01-17 NOTE — Telephone Encounter (Signed)
Received a call report on patient's lung cancer screening CT results from yesterday. Below are the impressions:  ? ?"IMPRESSION: ?1. New solid nodule of the left lower lobe measuring 10.6 mm in mean ?diameter. Lung-RADS 4B, suspicious. Additional imaging evaluation or ?consultation with Pulmonology or Thoracic Surgery recommended. ?2. Aortic Atherosclerosis (ICD10-I70.0)." ? ?Judson Roch, can you please advise? Thanks!  ?

## 2022-01-18 ENCOUNTER — Other Ambulatory Visit: Payer: Self-pay | Admitting: Acute Care

## 2022-01-18 DIAGNOSIS — R911 Solitary pulmonary nodule: Secondary | ICD-10-CM

## 2022-01-18 NOTE — Telephone Encounter (Signed)
Results/plan faxed to PCP 

## 2022-01-18 NOTE — Telephone Encounter (Signed)
I have called the patient with the results of her low-dose screening CT.  I explained that there is a new nodule in her left lower lobe measuring 10.6 mm.  Additionally there was a new small nodule in the left lower lobe measuring 2.6 mm. ?I explained to her that we will do a PET scan to better evaluate this area.  She is in agreement with this plan. 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  When I look back this has been noted on her scans since 2020.  The patient is on statin therapy, and she is followed by cardiology and has had a recent echo. ? ?Denise please fax results to PCP, let him know plan is for PET scan, and follow-up in the office with Dr. Patsey Berthold at [facility] [place] once the scan has been completed. ? ?Melissa McKenzie, please watch for PET scan date and schedule  with Dr. Patsey Berthold after the scan for follow up in the office.  ? ? ?

## 2022-02-01 ENCOUNTER — Ambulatory Visit (HOSPITAL_COMMUNITY)
Admission: RE | Admit: 2022-02-01 | Discharge: 2022-02-01 | Disposition: A | Discharge status: Home / Self Care | Payer: Medicare Other | Source: Ambulatory Visit | Attending: Acute Care | Admitting: Acute Care

## 2022-02-01 DIAGNOSIS — R911 Solitary pulmonary nodule: Secondary | ICD-10-CM | POA: Diagnosis present

## 2022-02-01 LAB — GLUCOSE, CAPILLARY: Glucose-Capillary: 102 mg/dL — ABNORMAL HIGH (ref 70–99)

## 2022-02-01 MED ORDER — FLUDEOXYGLUCOSE F - 18 (FDG) INJECTION
7.1300 | Freq: Once | INTRAVENOUS | Status: AC | PRN
  Administered 2022-02-01: 7.13 via INTRAVENOUS

## 2022-02-02 ENCOUNTER — Encounter: Payer: Self-pay | Admitting: Pulmonary Disease

## 2022-02-02 ENCOUNTER — Ambulatory Visit (INDEPENDENT_AMBULATORY_CARE_PROVIDER_SITE_OTHER): Payer: Medicare Other | Admitting: Pulmonary Disease

## 2022-02-02 VITALS — BP 124/72 | HR 74 | Temp 97.8°F | Ht 63.0 in | Wt 149.8 lb

## 2022-02-02 DIAGNOSIS — Z87891 Personal history of nicotine dependence: Secondary | ICD-10-CM | POA: Diagnosis not present

## 2022-02-02 DIAGNOSIS — R911 Solitary pulmonary nodule: Secondary | ICD-10-CM | POA: Diagnosis not present

## 2022-02-02 NOTE — Progress Notes (Signed)
Subjective:    Patient ID: Carolyn Richard, female    DOB: 06/21/1956, 66 y.o.   MRN: [id number] Patient Care Team: Marguarite Arbour, MD as PCP - General (Internal Medicine)  Chief Complaint  Patient presents with   Consult    Recent PET--c/o linger dry cough after recent cold.     HPI Patient is a 66 year old former smoker who presents for evaluation of an abnormal low-dose CT for lung cancer screening performed 16 January 2022.  She is kindly referred by Dr. Aram Beecham.  As noted the patient is enrolled in lung cancer screening program.  She was noted to have an abnormal CT chest on 16 January 2022.  The nodule in question is 10.6 mm in the left lower lobe.  The patient has been asymptomatic with regards to these findings.  As per lung cancer screening protocol the patient was scheduled for a PET/CT to further investigate this finding.  This was performed on 01 February 2022 (yesterday).  The PET/CT showed resolution of the process on the left lower lobe and a tiny 2 mm left upper lobe pulmonary nodule that is likely benign.  The patient recently had a mild URI and has some persistent dry cough but even this is getting better.  He has not had any weight loss or anorexia.  No sputum production.  No hemoptysis.  No fevers, chills or sweats.  Does not endorse any other symptomatology.  She has a 38+ pack-year history of smoking but quit smoking in 2006 with no evidence of recurrence.  No vaping.  Overall she feels well and looks well.   Review of Systems A 10 point review of systems was performed and it is as noted above otherwise negative.  Past Medical History:  Diagnosis Date   Anxiety    Complication of anesthesia    nausea   Depression    Dysrhythmia    PACs   Headache    sinus - rare   Hx of vasculitis of skin July 2016   resolved   Hyperlipidemia    Hypertension    Mitral valve prolapse    Osteoarthritis    spine   Scoliosis    Thyroid mass    Wears contact lenses     left eye   Wears hearing aid    bilateral   Past Surgical History:  Procedure Laterality Date   CERVICAL FUSION     C3-4   CESAREAN SECTION     COLONOSCOPY  2015   small polyp found due again in 5 years   CRYOTHERAPY     age 9   METATARSAL OSTEOTOMY Left 05/13/2015   Procedure: METATARSAL OSTEOTOMY LEFT 2ND;  Surgeon: Recardo Evangelist, DPM;  Location: [facility];  Service: Podiatry;  Laterality: Left;  LEFT 2ND METATARSAL   ORIF TOE FRACTURE Left 05/13/2015   Procedure: OPEN REDUCTION INTERNAL FIXATION (ORIF) METATARSAL (TOE) FRACTURE LEFT 4TH & 5TH;  Surgeon: Recardo Evangelist, DPM;  Location: [facility];  Service: Podiatry;  Laterality: Left;  4TH AND 5TH METATARSAL   TONSILLECTOMY     TYMPANOMASTOIDECTOMY Bilateral    Patient Active Problem List   Diagnosis Date Noted   Osteopenia after menopause 09/23/2018   Osteoarthritis    Hypertension    Hyperlipidemia    Anxiety    Depression    Family History  Problem Relation Age of Onset   Hypertension Mother    Pancreatic cancer Mother 26   Heart disease Father  Hypertension Father    Lymphoma Maternal Aunt    Liver cancer Paternal Uncle    Breast cancer Neg Hx    Social History   Tobacco Use   Smoking status: Former    Packs/day: 1.25    Years: 31.00    Pack years: 38.75    Types: Cigarettes    Quit date: 10/09/2004    Years since quitting: 17.3   Smokeless tobacco: Never  Substance Use Topics   Alcohol use: Yes    Alcohol/week: 3.0 standard drinks    Types: 3 Glasses of wine per week   Allergies  Allergen Reactions   Cashew Nut Oil Diarrhea    And nausea (all cashews)   Current Meds  Medication Sig   ALPRAZolam (XANAX) 0.25 MG tablet Take 0.25 mg by mouth 2 (two) times daily.   amLODipine (NORVASC) 5 MG tablet Take 5 mg by mouth daily. am   aspirin 81 MG tablet Take 81 mg by mouth daily. PM   B Complex-C (SUPER B COMPLEX PO) Take by mouth.   Biotin 1000 MCG tablet Take by mouth.   buPROPion  (WELLBUTRIN XL) 150 MG 24 hr tablet    calcium carbonate (TUMS - DOSED IN MG ELEMENTAL CALCIUM) 500 MG chewable tablet Chew 1 tablet by mouth daily.   cetirizine (ZYRTEC) 10 MG tablet Take 10 mg by mouth daily as needed for allergies.   Cholecalciferol (VITAMIN D-3 PO) Take 2,000 Units by mouth daily.    Chromium Picolinate (CHROMIUM PICOLATE PO) Take by mouth.   Coenzyme Q10 (COQ10 PO) Take by mouth.   fluticasone (FLONASE) 50 MCG/ACT nasal spray Place into both nostrils daily. AM   hydrochlorothiazide (HYDRODIURIL) 12.5 MG tablet Take 12.5 mg by mouth daily. AM   mometasone (ELOCON) 0.1 % cream Apply 1 application topically as directed. Qd to bid aa rash on legs until clear then prn flares Qd up to 4 days a week aa itchy rash on forehead, eyebrows and scalp until clear then prn flares   naproxen sodium (ANAPROX) 220 MG tablet Take 440 mg by mouth daily. AM   Omega-3 Fatty Acids (FISH OIL PO) Take by mouth.   Prasterone (INTRAROSA) 6.5 MG INST Place 1 suppository vaginally at bedtime.   simvastatin (ZOCOR) 10 MG tablet Take 10 mg by mouth daily. PM   traZODone (DESYREL) 100 MG tablet Take 100 mg by mouth at bedtime.   Immunization History  Administered Date(s) Administered   Influenza Split 08/26/2015   Influenza, Quadrivalent, Recombinant, Inj, Pf 08/07/2019   Influenza,inj,Quad PF,6+ Mos 09/23/2018   Influenza-Unspecified 11/20/2016, 08/13/2017, 09/26/2018   Pneumococcal Polysaccharide-23 08/07/2019       Objective:   Physical Exam BP 124/72 (BP Location: Left Arm, Cuff Size: Normal)   Pulse 74   Temp 97.8 F (36.6 C) (Temporal)   Ht  (1.6 m)   Wt 149 lb 12.8 oz (67.9 kg)   SpO2 100%   BMI 26.54 kg/m  GENERAL: Well-developed, well-nourished woman, no acute distress.  No conversational dyspnea.  Fully ambulatory. HEAD: Normocephalic, atraumatic.  EYES: Pupils equal, round, reactive to light.  No scleral icterus.  MOUTH: Oral mucosa moist.  No thrush. NECK: Supple. No  thyromegaly. Trachea midline. No JVD.  No adenopathy. PULMONARY: Good air entry bilaterally.  No adventitious sounds. CARDIOVASCULAR: S1 and S2. Regular rate and rhythm.  ABDOMEN: Benign. MUSCULOSKELETAL: No joint deformity, no clubbing, no edema.  NEUROLOGIC: No overt focal deficit, no gait disturbance, speech is fluent. SKIN: Intact,warm,dry. PSYCH:  Mood and behavior normal.  Representative image from LDCT chest for lung cancer screening performed 16 January 2022 showing a solid nodule in the left lower lobe measuring 10.6 mm in diameter read as lung RADS 4B, suspicious:   Representative image of PET/CT performed 01 February 2022 showing that the lesion in question is decreasing in size as well as no FDG activity noted:      Assessment & Plan:     ICD-10-CM   1. Lung nodule  R91.1 CT CHEST WO CONTRAST   Appears to have been a focal area of infection/inflammation Decreasing in size with no FDG activity Repeat CT chest 3 months to ensure resolution     2. Former smoker  Z87.891    No evidence of relapse If follow-up CT clear at 3 months resume LDCT screening     Orders Placed This Encounter  Procedures   CT CHEST WO CONTRAST    Standing Status:   Future    Number of Occurrences:   1    Standing Expiration Date:   02/03/2023    Scheduling Instructions:     49mo    Order Specific Question:   Preferred imaging location?    Answer:   [facility]   It appears that the patient had a focus of pneumonia/inflammation that is now resolving.  We will repeat a CT scan of the chest in 3 months time.  We will determine follow-up pending those results.   Gailen Shelter, MD Advanced Bronchoscopy PCCM [place] [facility] [facility]    *This note was dictated using voice recognition software/Dragon.  Despite best efforts to proofread, errors can occur which can change the meaning. Any transcriptional errors that result from this process are unintentional and may not be fully corrected  at the time of dictation.

## 2022-02-02 NOTE — Patient Instructions (Signed)
The small nodule noted on the left lung on the lower portion is actually getting better.  I suspect that this represented a small focus of pneumonia. ? ?We will repeat a CT scan in 3 months time to make sure that this resolves completely. ? ? ?We will see you in follow-up as needed if any further work-up of this issue is necessary. ?

## 2022-02-07 ENCOUNTER — Ambulatory Visit (INDEPENDENT_AMBULATORY_CARE_PROVIDER_SITE_OTHER): Payer: Medicare Other | Admitting: Dermatology

## 2022-02-07 DIAGNOSIS — D229 Melanocytic nevi, unspecified: Secondary | ICD-10-CM

## 2022-02-07 DIAGNOSIS — L814 Other melanin hyperpigmentation: Secondary | ICD-10-CM

## 2022-02-07 DIAGNOSIS — D1800 Hemangioma unspecified site: Secondary | ICD-10-CM

## 2022-02-07 DIAGNOSIS — L720 Epidermal cyst: Secondary | ICD-10-CM

## 2022-02-07 DIAGNOSIS — L578 Other skin changes due to chronic exposure to nonionizing radiation: Secondary | ICD-10-CM

## 2022-02-07 DIAGNOSIS — L821 Other seborrheic keratosis: Secondary | ICD-10-CM

## 2022-02-07 DIAGNOSIS — L300 Nummular dermatitis: Secondary | ICD-10-CM

## 2022-02-07 DIAGNOSIS — Z1283 Encounter for screening for malignant neoplasm of skin: Secondary | ICD-10-CM

## 2022-02-07 DIAGNOSIS — L700 Acne vulgaris: Secondary | ICD-10-CM

## 2022-02-07 DIAGNOSIS — D2272 Melanocytic nevi of left lower limb, including hip: Secondary | ICD-10-CM

## 2022-02-07 DIAGNOSIS — L219 Seborrheic dermatitis, unspecified: Secondary | ICD-10-CM | POA: Diagnosis not present

## 2022-02-07 DIAGNOSIS — D225 Melanocytic nevi of trunk: Secondary | ICD-10-CM

## 2022-02-07 MED ORDER — FLUOCINOLONE ACETONIDE BODY 0.01 % EX OIL: 1.0000 "application " | TOPICAL_OIL | Freq: Every day | CUTANEOUS | 1 refills | Status: AC | PRN

## 2022-02-07 MED ORDER — TRETINOIN 0.1 % EX CREA: TOPICAL_CREAM | Freq: Every day | CUTANEOUS | 6 refills | Status: AC

## 2022-02-07 MED ORDER — VTAMA 1 % EX CREA: 1.0000 "application " | TOPICAL_CREAM | Freq: Every day | CUTANEOUS | 6 refills | Status: DC | PRN

## 2022-02-07 NOTE — Progress Notes (Signed)
? ?Follow-Up Visit ?  ?Subjective  ?Carolyn Richard is a 66 y.o. female who presents for the following: Annual Exam (Tbse. Hx of nummular derm at neck, used mometasone cream. Hx of seb derm at scalp, eyebrows, and forehead. Used Ketoconazole cream and Mometasone cream at aa's did not feel like it helped so stopped. Currently using fluocinolone 0.1 % solution to aa's qd prn. ). ? ?The patient presents for Total-Body Skin Exam (TBSE) for skin cancer screening and mole check.  The patient has spots, moles and lesions to be evaluated, some may be new or changing and the patient has concerns that these could be cancer. ? ? ? ?The following portions of the chart were reviewed this encounter and updated as appropriate:   ?  ? ?Review of Systems: No other skin or systemic complaints except as noted in HPI or Assessment and Plan. ? ? ?Objective  ?Well appearing patient in no apparent distress; mood and affect are within normal limits. ? ?A full examination was performed including scalp, head, eyes, ears, nose, lips, neck, chest, axillae, abdomen, back, buttocks, bilateral upper extremities, bilateral lower extremities, hands, feet, fingers, toes, fingernails, and toenails. All findings within normal limits unless otherwise noted below. ? ?b/l jaw ?Small noninflamed subcutaneous nodules with pitted scarring at b/l jaw  ? ?eyebrows, face, scalp ?Mild erythema on forehead, nasolabial folds, and eyebrows.  ? ?neck ? pink papules and patches at anterior neck  ? ?left upper knee ?4 mm flesh papule with superior brown edge  ? ?left lower abdomen ?5 mm med light brown macule slight irregular border with adjacent 2 mm light brown macule  ? ? 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 ?Chronic and persistent condition with duration or expected duration over one year. Condition is symptomatic/ bothersome to patient. Not currently at goal.  ? ?Continue tretinoin 0.1 % cream - apply topically to aa's at jaw for nightly for cystic acne.   ? ?Topical retinoid medications like tretinoin/Retin-A, adapalene/Differin, tazarotene/Fabior, and Epiduo/Epiduo Forte can cause dryness and irritation when first started. Only apply a pea-sized amount to the entire affected area. Avoid applying it around the eyes, edges of mouth and creases at the nose. If you experience irritation, use a good moisturizer first and/or apply the medicine less often. If you are doing well with the medicine, you can increase how often you use it until you are applying every night. Be careful with sun protection while using this medication as it can make you sensitive to the sun. This medicine should not be used by pregnant women.  ? ? ?tretinoin (RETIN-A) 0.1 % cream - b/l jaw ?Apply topically at bedtime. qhs to aa's for cystic acne ? ?Epidermal cyst ? ?Seborrheic dermatitis ?eyebrows, face, scalp ? ?Chronic condition with duration or expected duration over one year. Currently well-controlled.  ? ?Seborrheic Dermatitis  ?-  is a chronic persistent rash characterized by pinkness and scaling most commonly of the mid face but also can occur on the scalp (dandruff), ears; mid chest, mid back and groin.  It tends to be exacerbated by stress and cooler weather.  People who have neurologic disease may experience new onset or exacerbation of existing seborrheic dermatitis.  The condition is not curable but treatable and can be controlled. ? ?Cont fluocinolone 0.1% oil PRN flares. Refills sent to [facility]. Patient will use Goodrx.  ? ?Start Vtama 1% cream - use once daily to aa's for seb derm. 2 samples given. ?Rx sent to  [facility] in [place] discussed price may be more than $35 due to medicare. contact information given to patient. Patient will call if unable to get rx of vtama from [facility]. ? ? ? ? ? ?Fluocinolone Acetonide Body 0.01 % OIL - eyebrows, face, scalp ?Apply 1 application. topically daily as needed. Apply to aa's eyebrows, forehead  and scalp for seb  derm ? ?Tapinarof (VTAMA) 1 % CREA - eyebrows, face, scalp ?Apply 1 application. topically daily as needed. Apply to aa of body for seb derm ? 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 ?Vrs irritant contact dermatitis (applied aloe vera gel to neck last night) ? ?Can Start flucinolone body oil 0.01 - apply to aa's neck qd/bid until rash cleared ? ?Avoid applying aloe vera gel ? ?Recommend mild soap and moisturizing cream 1-2 times daily.  Gentle skin care handout provided.   ? ?Nevus (2) ?left upper knee; left lower abdomen ? ?Benign-appearing.  Stable. Observation.  Call clinic for new or changing lesions.  Recommend daily use of broad spectrum spf 30+ sunscreen to sun-exposed areas.  ? ? ? ?Lentigines ?- Scattered tan macules ?- Due to sun exposure ?- Benign-appearing, observe ?- Recommend daily broad spectrum sunscreen SPF 30+ to sun-exposed areas, reapply every 2 hours as needed. ?- Call for any changes ? ?Seborrheic Keratoses ?- Stuck-on, waxy, tan-brown papules and/or plaques at right upper abdomen  ?- Benign-appearing ?- Discussed benign etiology and prognosis. ?- Observe ?- Call for any changes ? ?Melanocytic Nevi ?- Tan-brown and/or pink-flesh-colored symmetric macules and papules ?- Benign appearing on exam today ?- Observation ?- Call clinic for new or changing moles ?- Recommend daily use of broad spectrum spf 30+ sunscreen to sun-exposed areas.  ? ?Hemangiomas ?- Red papules ?- Discussed benign nature ?- Observe ?- Call for any changes ? ?Actinic Damage ?- Chronic condition, secondary to cumulative UV/sun exposure ?- diffuse scaly erythematous macules with underlying dyspigmentation ?- Recommend daily broad spectrum sunscreen SPF 30+ to sun-exposed areas, reapply every 2 hours as needed.  ?- Staying in the shade or wearing long sleeves, sun glasses (UVA+UVB protection) and wide brim hats (4-inch brim around the entire circumference of the hat) are also recommended for sun protection.  ?- Call for new or changing  lesions. ? ?Skin cancer screening performed today. ?Return in about 1 year (around 02/08/2023) for TBSE. ?I, Ruthell Rummage, CMA, am acting as scribe for Brendolyn Patty, MD. ? ?Documentation: I have reviewed the above documentation for accuracy and completeness, and I agree with the above. ? ?Brendolyn Patty MD  ? ?

## 2022-02-07 NOTE — Patient Instructions (Addendum)
[place]  ?[phone] ? ? ? ?Melanoma ABCDEs ? ?Melanoma is the most dangerous type of skin cancer, and is the leading cause of death from skin disease.  You are more likely to develop melanoma if you: ?Have light-colored skin, light-colored eyes, or red or blond hair ?Spend a lot of time in the sun ?Tan regularly, either outdoors or in a tanning bed ?Have had blistering sunburns, especially during childhood ?Have a close family member who has had a melanoma ?Have atypical moles or large birthmarks ? ?Early detection of melanoma is key since treatment is typically straightforward and cure rates are extremely high if we catch it early.  ? ?The first sign of melanoma is often a change in a mole or a new dark spot.  The ABCDE system is a way of remembering the signs of melanoma. ? ?A for asymmetry:  The two halves do not match. ?B for border:  The edges of the growth are irregular. ?C for color:  A mixture of colors are present instead of an even brown color. ?D for diameter:  Melanomas are usually (but not always) greater than 68m - the size of a pencil eraser. ?E for evolution:  The spot keeps changing in size, shape, and color. ? ?Please check your skin once per month between visits. You can use a small mirror in front and a large mirror behind you to keep an eye on the back side or your body.  ? ?If you see any new or changing lesions before your next follow-up, please call to schedule a visit. ? ?Please continue daily skin protection including broad spectrum sunscreen SPF 30+ to sun-exposed areas, reapplying every 2 hours as needed when you're outdoors.  ? ?Staying in the shade or wearing long sleeves, sun glasses (UVA+UVB protection) and wide brim hats (4-inch brim around the entire circumference of the hat) are also recommended for sun protection.   ? ?If You Need Anything After Your Visit ? ?If you have any questions or concerns for your doctor, please call our main line  at [phone] press option 4 to reach your doctor's medical assistant. If no one answers, please leave a voicemail as directed and we will return your call as soon as possible. Messages left after 4 pm will be answered the following business day.  ? ?You may also send [place] message via MyChart. We typically respond to MyChart messages within 1-2 business days. ? ?For prescription refills, please ask your pharmacy to contact our office. Our fax number is [phone] ? ?If you have an urgent issue when the clinic is closed that cannot wait until the next business day, you can page your doctor at the number below.   ? ?Please note that while we do our best to be available for urgent issues outside of office hours, we are not available 24/7.  ? ?If you have an urgent issue and are unable to reach [place] you may choose to seek medical care at your doctor's office, retail clinic, urgent care center, or emergency room. ? ?If you have a medical emergency, please immediately call 911 or go to the emergency department. ? ?Pager Numbers ? ?- Dr. KNehemiah Massed [phone]? ?- Dr. MLaurence Ferrari [phone]? ?- Dr. SNicole Kindred [phone]? ?In the event of inclement weather, please call our main line at [phone] an update on the status of any delays or closures. ? ?Dermatology Medication Tips: ?Please keep the boxes that topical  medications come in in order to help keep track of the instructions about where and how to use these. Pharmacies typically print the medication instructions only on the boxes and not directly on the medication tubes.  ? ?If your medication is too expensive, please contact our office at [phone] option 4 or send [place] a message through [place].  ? ?We are unable to tell what your co-pay for medications will be in advance as this is different depending on your insurance coverage. However, we may be able to find a substitute medication at lower cost or fill out paperwork to get insurance to cover a  needed medication.  ? ?If a prior authorization is required to get your medication covered by your insurance company, please allow [place] 1-2 business days to complete this process. ? ?Drug prices often vary depending on where the prescription is filled and some pharmacies may offer cheaper prices. ? ?The website www.goodrx.com contains coupons for medications through different pharmacies. The prices here do not account for what the cost may be with help from insurance (it may be cheaper with your insurance), but the website can give you the price if you did not use any insurance.  ?- You can print the associated coupon and take it with your prescription to the pharmacy.  ?- You may also stop by our office during regular business hours and pick up a GoodRx coupon card.  ?- If you need your prescription sent electronically to a different pharmacy, notify our office through [facility] or by phone at [phone] option 4. ? ? ? ? ?Si Usted Necesita Algo Despu?s de Su Visita ? ?Tambi?n puede enviarnos un mensaje a trav?s de MyChart. Por lo general respondemos a los mensajes de MyChart en el transcurso de 1 a 2 d?as h?biles. ? ?Para renovar recetas, por favor pida a su farmacia que se ponga en contacto con nuestra oficina. Nuestro n?mero de fax es el [phone]. ? ?Si tiene un asunto urgente cuando la cl?nica est? cerrada y que no puede esperar hasta el siguiente d?a h?bil, puede llamar/localizar a su doctor(a) al n?mero que aparece a continuaci?n.  ? ?Por favor, tenga en cuenta que aunque hacemos todo lo posible para estar disponibles para asuntos urgentes fuera del horario de oficina, no estamos disponibles las 24 horas del d?a, los 7 d?as de la semana.  ? ?Si tiene un problema urgente y no puede comunicarse con nosotros, puede optar por buscar atenci?n m?dica  en el consultorio de su doctor(a), en una cl?nica privada, en un centro de atenci?n urgente o en una sala de emergencias. ? ?Si tiene Engineer, maintenance (IT)  m?dica, por favor llame inmediatamente al 911 o vaya a la sala de emergencias. ? ?N?meros de b?per ? ?- Dr. Nehemiah Massed: [phone] ? ?- Dra. Moye: [phone] ? ?- Dra. Nicole Kindred: [phone] ? ?En caso de inclemencias del tiempo, por favor llame a nuestra l?nea principal al [phone] para una actualizaci?n sobre el estado de cualquier retraso o cierre. ? ?Consejos para la medicaci?n en dermatolog?a: ?Por favor, guarde las cajas en las que vienen los medicamentos de uso t?pico para ayudarle a seguir las instrucciones sobre d?nde y c?mo usarlos. Las farmacias generalmente imprimen las instrucciones del medicamento s?lo en las cajas y no directamente en los tubos del [place].  ? ?Si su medicamento es muy caro, por favor, p?ngase en contacto con Zigmund Daniel llamando al [phone] y presione la opci?n 4 o env?enos un mensaje a trav?s de MyChart.  ? ?No podemos  decirle cu?l ser? su copago por los medicamentos por adelantado ya que esto es diferente dependiendo de la cobertura de su seguro. Sin embargo, es posible que podamos encontrar un medicamento sustituto a Electrical engineer un formulario para que el seguro cubra el medicamento que se considera necesario.  ? ?Si se requiere Ardelia Mems autorizaci?n previa para que su compa??a de seguros [place] su medicamento, por favor perm?tanos de 1 a 2 d?as h?biles para completar este proceso. ? ?Los precios de los medicamentos var?an con frecuencia dependiendo del Environmental consultant de d?nde se surte la receta y alguna farmacias pueden ofrecer precios m?s baratos. ? ?El sitio web www.goodrx.com tiene cupones para medicamentos de Airline pilot. Los precios aqu? no tienen en cuenta lo que podr?a costar con la ayuda del seguro (puede ser m?s barato con su seguro), pero el sitio web puede darle el precio si no utiliz? ning?n seguro.  ?- Puede imprimir el cup?n correspondiente y llevarlo con su receta a la farmacia.  ?- Tambi?n puede pasar por nuestra oficina durante el horario de  atenci?n regular y recoger una tarjeta de cupones de GoodRx.  ?- Si necesita que su receta se env?e electr?nicamente a Chiropodist, informe a nuestra oficina a trav?s [employer] o por

## 2022-02-14 ENCOUNTER — Ambulatory Visit: Payer: Medicare Other | Admitting: Pulmonary Disease

## 2022-04-16 IMAGING — CT CT CHEST LUNG CANCER SCREENING LOW DOSE W/O CM
2 of 5 series · 15 of 40 positions shown, 18 images · non-contrast
Comparison: Lung cancer screening CT dated October 21, 2020

CLINICAL DATA: Former smoker with 39 pack-year history



[Series 3: lung 1.00 · axial · 0.66mm/px · z∈[-1209,-906]mm · 12 of 335 slices shown, 15 images]
[im 16/335  mediastinal]
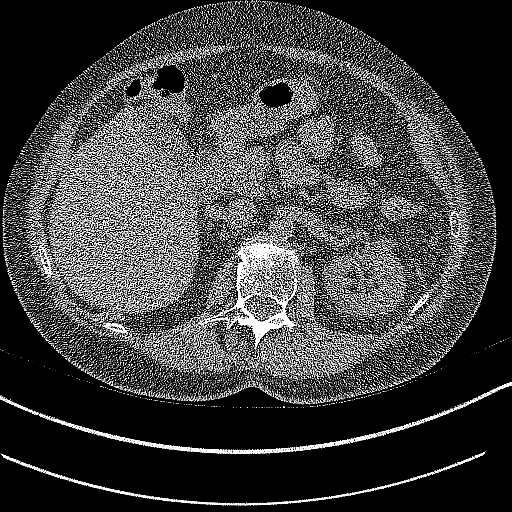
[im 16/335  lung]
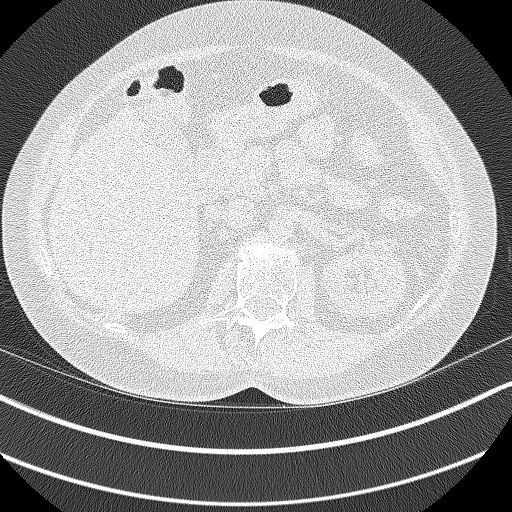
[im 46/335  lung]
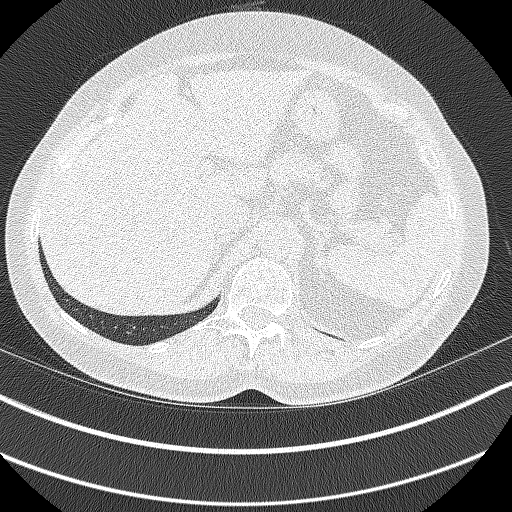
[im 76/335  lung]
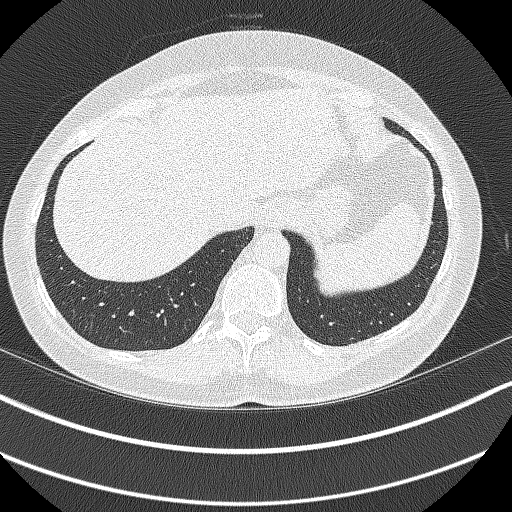
[im 107/335  lung]
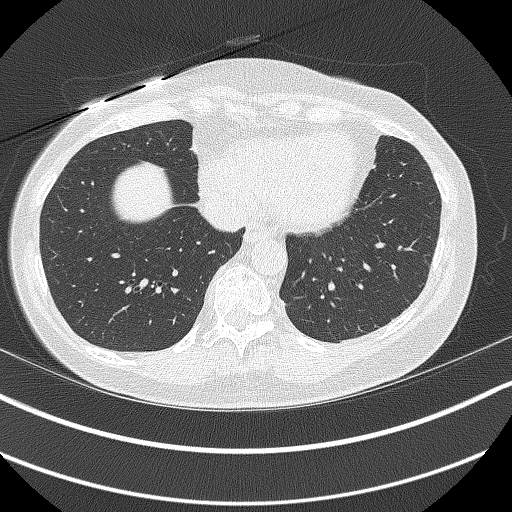
[im 122/335  mediastinal]
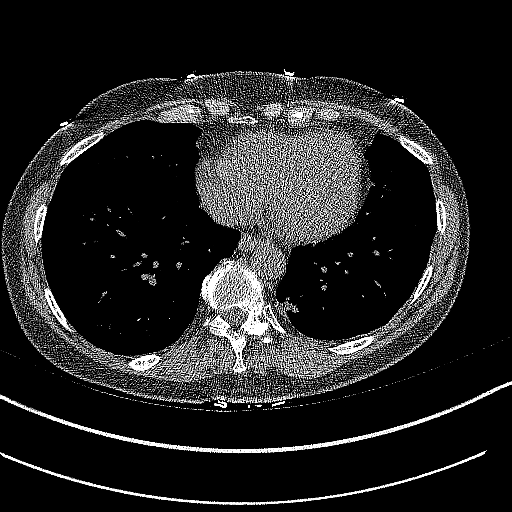
[im 122/335  lung]
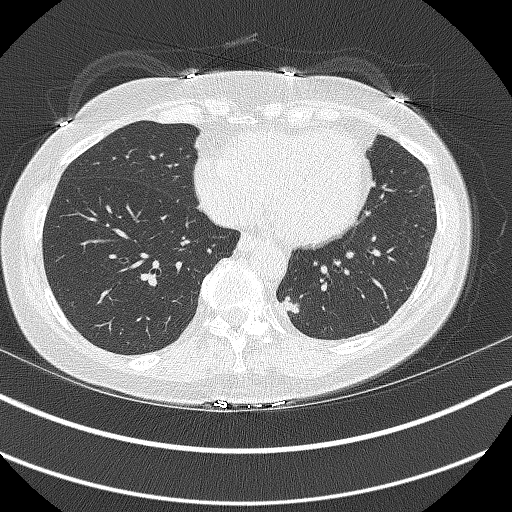
[im 152/335  lung]
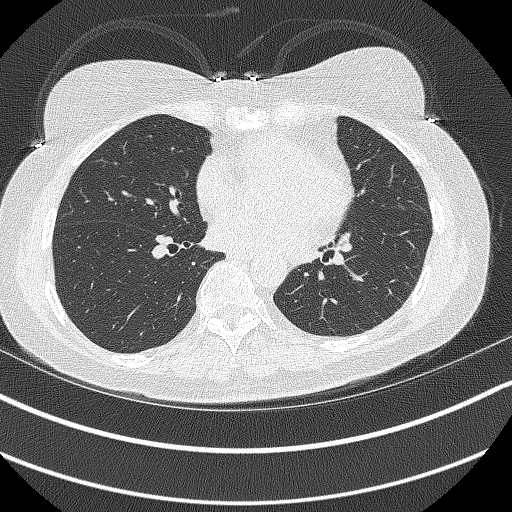
[im 183/335  lung]
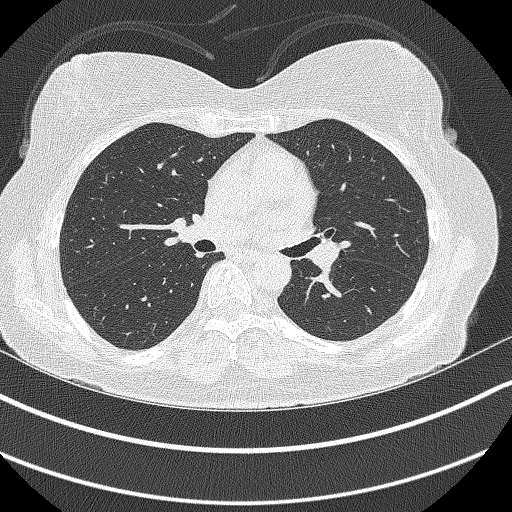
[im 213/335  lung]
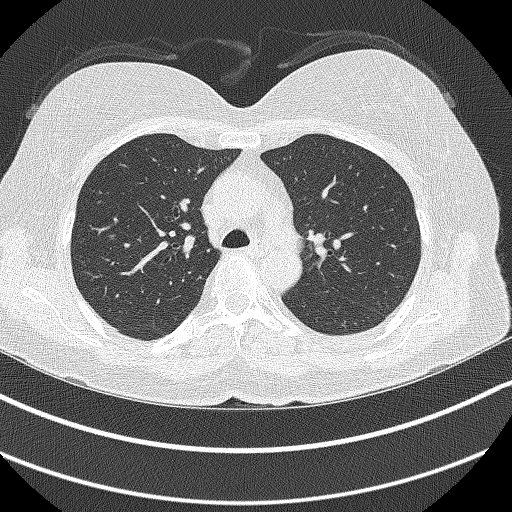
[im 228/335  mediastinal]
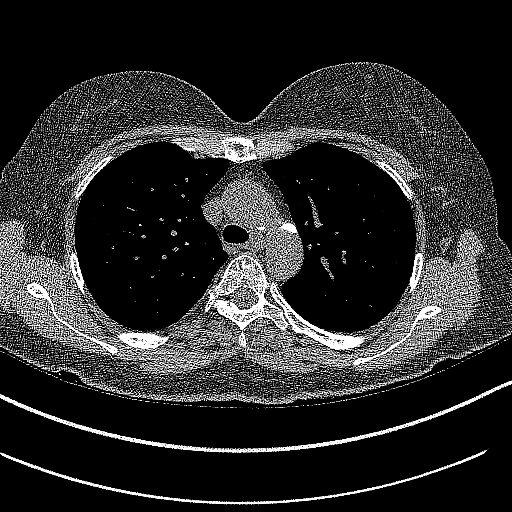
[im 228/335  lung]
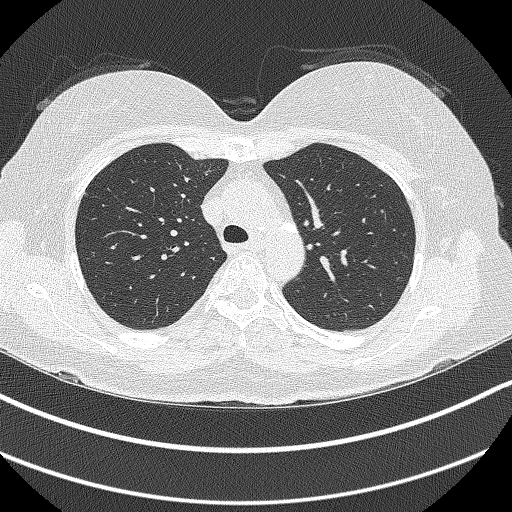
[im 259/335  lung]
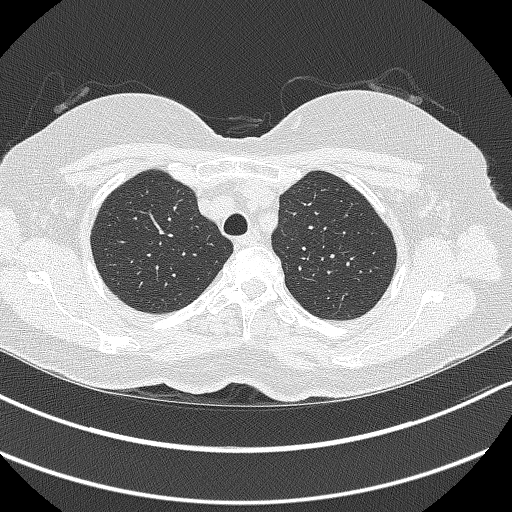
[im 289/335  lung]
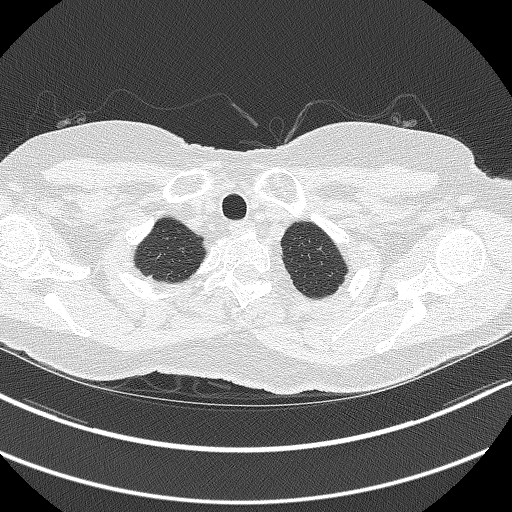
[im 319/335  lung]
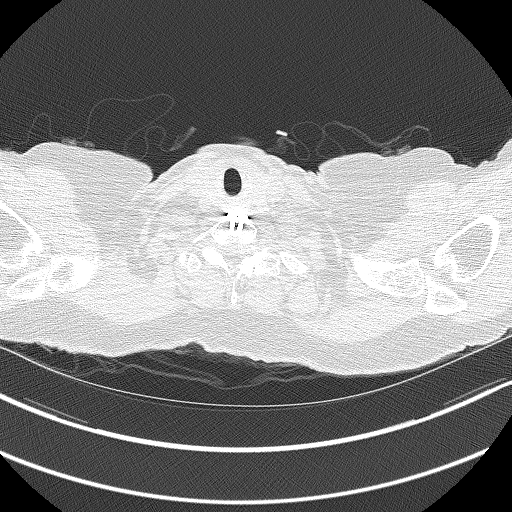

[Series 5: coronals lung 1.00 cor · coronal · 0.66mm/px · 3 of 320 slices shown]
[im 64/320  lung]
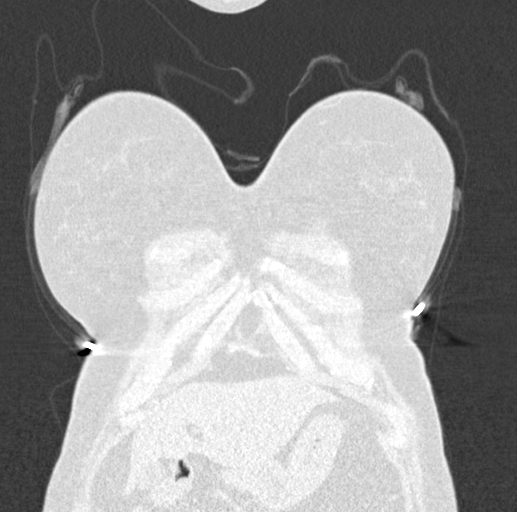
[im 128/320  lung]
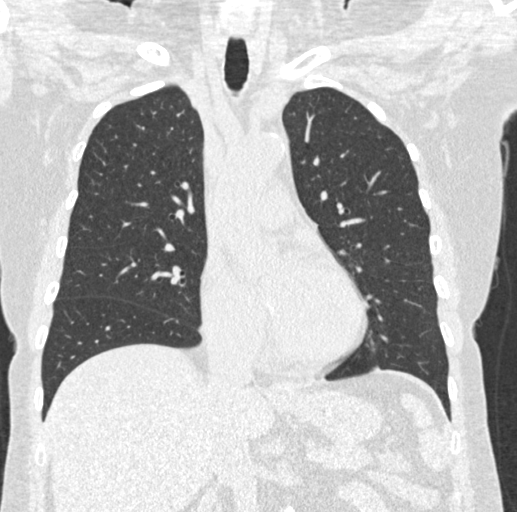
[im 192/320  lung]
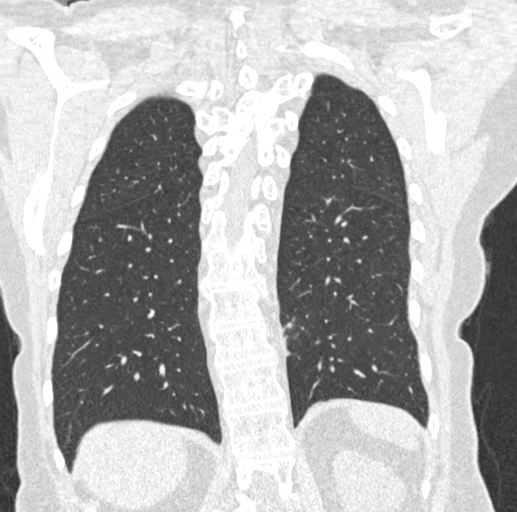

[15 of 40 positions shown; findings below may reference images not displayed]

FINDINGS: Cardiovascular: Normal heart size. No pericardial effusion.
Atherosclerotic disease of the thoracic aorta.

Mediastinum/Nodes: Esophagus and thyroid are unremarkable. No
pathologically enlarged lymph nodes seen in the chest.

Lungs/Pleura: Central airways are patent. No consolidation, pleural
effusion or pneumothorax. New solid lobulated nodule of the left
lower lobe measuring 10.6 mm in mean diameter on image 214.
Additional new small nodule is seen in the left lower lobe measuring
2.6 mm in mean diameter on image 218. Other previously seen
pulmonary nodules are stable or absent when compared with prior
exam.

Upper Abdomen: No acute abnormality.

Musculoskeletal: No chest wall mass or suspicious bone lesions
identified.
IMPRESSION: 1. New solid nodule of the left lower lobe measuring 10.6 mm in mean
diameter. Lung-RADS 4B, suspicious. Additional imaging evaluation or
consultation with Pulmonology or Thoracic Surgery recommended.
2. Aortic Atherosclerosis (OG1NL-BO9.9).

These results will be called to the ordering clinician or
representative by the Radiologist Assistant, and communication
documented in the PACS or [REDACTED].

## 2022-06-13 ENCOUNTER — Ambulatory Visit
Admission: RE | Admit: 2022-06-13 | Discharge: 2022-06-13 | Disposition: A | Discharge status: Home / Self Care | Payer: Medicare Other | Source: Ambulatory Visit | Attending: Pulmonary Disease | Admitting: Pulmonary Disease

## 2022-06-13 DIAGNOSIS — I700 Atherosclerosis of aorta: Secondary | ICD-10-CM | POA: Diagnosis not present

## 2022-06-13 DIAGNOSIS — R911 Solitary pulmonary nodule: Secondary | ICD-10-CM | POA: Insufficient documentation

## 2022-06-14 ENCOUNTER — Other Ambulatory Visit: Payer: Self-pay

## 2022-08-07 ENCOUNTER — Other Ambulatory Visit: Payer: Self-pay | Admitting: Internal Medicine

## 2022-08-07 DIAGNOSIS — I2510 Atherosclerotic heart disease of native coronary artery without angina pectoris: Secondary | ICD-10-CM

## 2022-08-11 ENCOUNTER — Ambulatory Visit
Admission: RE | Admit: 2022-08-11 | Discharge: 2022-08-11 | Disposition: A | Discharge status: Home / Self Care | Payer: Medicare Other | Source: Ambulatory Visit | Attending: Internal Medicine | Admitting: Internal Medicine

## 2022-08-11 DIAGNOSIS — I2510 Atherosclerotic heart disease of native coronary artery without angina pectoris: Secondary | ICD-10-CM | POA: Insufficient documentation

## 2022-08-11 DIAGNOSIS — I2584 Coronary atherosclerosis due to calcified coronary lesion: Secondary | ICD-10-CM | POA: Insufficient documentation

## 2023-02-19 ENCOUNTER — Ambulatory Visit (INDEPENDENT_AMBULATORY_CARE_PROVIDER_SITE_OTHER): Payer: Medicare Other | Admitting: Dermatology

## 2023-02-19 VITALS — BP 122/73 | HR 65

## 2023-02-19 DIAGNOSIS — Z1283 Encounter for screening for malignant neoplasm of skin: Secondary | ICD-10-CM | POA: Diagnosis not present

## 2023-02-19 DIAGNOSIS — L700 Acne vulgaris: Secondary | ICD-10-CM

## 2023-02-19 DIAGNOSIS — W908XXA Exposure to other nonionizing radiation, initial encounter: Secondary | ICD-10-CM

## 2023-02-19 DIAGNOSIS — L814 Other melanin hyperpigmentation: Secondary | ICD-10-CM | POA: Diagnosis not present

## 2023-02-19 DIAGNOSIS — L219 Seborrheic dermatitis, unspecified: Secondary | ICD-10-CM

## 2023-02-19 DIAGNOSIS — L820 Inflamed seborrheic keratosis: Secondary | ICD-10-CM | POA: Diagnosis not present

## 2023-02-19 DIAGNOSIS — L821 Other seborrheic keratosis: Secondary | ICD-10-CM | POA: Diagnosis not present

## 2023-02-19 DIAGNOSIS — D225 Melanocytic nevi of trunk: Secondary | ICD-10-CM

## 2023-02-19 DIAGNOSIS — L578 Other skin changes due to chronic exposure to nonionizing radiation: Secondary | ICD-10-CM

## 2023-02-19 DIAGNOSIS — D229 Melanocytic nevi, unspecified: Secondary | ICD-10-CM

## 2023-02-19 DIAGNOSIS — X32XXXA Exposure to sunlight, initial encounter: Secondary | ICD-10-CM

## 2023-02-19 DIAGNOSIS — D2272 Melanocytic nevi of left lower limb, including hip: Secondary | ICD-10-CM

## 2023-02-19 DIAGNOSIS — L738 Other specified follicular disorders: Secondary | ICD-10-CM

## 2023-02-19 MED ORDER — TRETINOIN 0.05 % EX CREA: TOPICAL_CREAM | CUTANEOUS | 3 refills | Status: DC

## 2023-02-19 NOTE — Patient Instructions (Addendum)
Cryotherapy Aftercare  Wash gently with soap and water everyday.   Apply Vaseline and Band-Aid daily until healed.   Melanoma ABCDEs  Melanoma is the most dangerous type of skin cancer, and is the leading cause of death from skin disease.  You are more likely to develop melanoma if you: Have light-colored skin, light-colored eyes, or red or blond hair Spend a lot of time in the sun Tan regularly, either outdoors or in a tanning bed Have had blistering sunburns, especially during childhood Have a close family member who has had a melanoma Have atypical moles or large birthmarks  Early detection of melanoma is key since treatment is typically straightforward and cure rates are extremely high if we catch it early.   The first sign of melanoma is often a change in a mole or a new dark spot.  The ABCDE system is a way of remembering the signs of melanoma.  A for asymmetry:  The two halves do not match. B for border:  The edges of the growth are irregular. C for color:  A mixture of colors are present instead of an even brown color. D for diameter:  Melanomas are usually (but not always) greater than 6mm - the size of a pencil eraser. E for evolution:  The spot keeps changing in size, shape, and color.  Please check your skin once per month between visits. You can use a small mirror in front and a large mirror behind you to keep an eye on the back side or your body.   If you see any new or changing lesions before your next follow-up, please call to schedule a visit.  Please continue daily skin protection including broad spectrum sunscreen SPF 30+ to sun-exposed areas, reapplying every 2 hours as needed when you're outdoors.   Staying in the shade or wearing long sleeves, sun glasses (UVA+UVB protection) and wide brim hats (4-inch brim around the entire circumference of the hat) are also recommended for sun protection.      Due to recent changes in healthcare laws, you may see results of  your pathology and/or laboratory studies on MyChart before the doctors have had a chance to review them. We understand that in some cases there may be results that are confusing or concerning to you. Please understand that not all results are received at the same time and often the doctors may need to interpret multiple results in order to provide you with the best plan of care or course of treatment. Therefore, we ask that you please give us 2 business days to thoroughly review all your results before contacting the office for clarification. Should we see a critical lab result, you will be contacted sooner.   If You Need Anything After Your Visit  If you have any questions or concerns for your doctor, please call our main line at [phone] and press option 4 to reach your doctor's medical assistant. If no one answers, please leave a voicemail as directed and we will return your call as soon as possible. Messages left after 4 pm will be answered the following business day.   You may also send us a message via MyChart. We typically respond to MyChart messages within 1-2 business days.  For prescription refills, please ask your pharmacy to contact our office. Our fax number is [phone].  If you have an urgent issue when the clinic is closed that cannot wait until the next business day, you can page your doctor at the number   below.    Please note that while we do our best to be available for urgent issues outside of office hours, we are not available 24/7.   If you have an urgent issue and are unable to reach us, you may choose to seek medical care at your doctor's office, retail clinic, urgent care center, or emergency room.  If you have a medical emergency, please immediately call 911 or go to the emergency department.  Pager Numbers  - Dr. Kowalski: [phone]  - Dr. Moye: [phone]  - Dr. Stewart: [phone]  In the event of inclement weather, please call our main line at  [phone] for an update on the status of any delays or closures.  Dermatology Medication Tips: Please keep the boxes that topical medications come in in order to help keep track of the instructions about where and how to use these. Pharmacies typically print the medication instructions only on the boxes and not directly on the medication tubes.   If your medication is too expensive, please contact our office at [phone] option 4 or send us a message through MyChart.   We are unable to tell what your co-pay for medications will be in advance as this is different depending on your insurance coverage. However, we may be able to find a substitute medication at lower cost or fill out paperwork to get insurance to cover a needed medication.   If a prior authorization is required to get your medication covered by your insurance company, please allow us 1-2 business days to complete this process.  Drug prices often vary depending on where the prescription is filled and some pharmacies may offer cheaper prices.  The website www.goodrx.com contains coupons for medications through different pharmacies. The prices here do not account for what the cost may be with help from insurance (it may be cheaper with your insurance), but the website can give you the price if you did not use any insurance.  - You can print the associated coupon and take it with your prescription to the pharmacy.  - You may also stop by our office during regular business hours and pick up a GoodRx coupon card.  - If you need your prescription sent electronically to a different pharmacy, notify our office through [facility] MyChart or by phone at [phone] option 4.     Si Usted Necesita Algo Despus de Su Visita  Tambin puede enviarnos un mensaje a travs de MyChart. Por lo general respondemos a los mensajes de MyChart en el transcurso de 1 a 2 das hbiles.  Para renovar recetas, por favor pida a su farmacia que se  ponga en contacto con nuestra oficina. Nuestro nmero de fax es el [phone].  Si tiene un asunto urgente cuando la clnica est cerrada y que no puede esperar hasta el siguiente da hbil, puede llamar/localizar a su doctor(a) al nmero que aparece a continuacin.   Por favor, tenga en cuenta que aunque hacemos todo lo posible para estar disponibles para asuntos urgentes fuera del horario de oficina, no estamos disponibles las 24 horas del da, los 7 das de la semana.   Si tiene un problema urgente y no puede comunicarse con nosotros, puede optar por buscar atencin mdica  en el consultorio de su doctor(a), en una clnica privada, en un centro de atencin urgente o en una sala de emergencias.  Si tiene una emergencia mdica, por favor llame inmediatamente al 911 o vaya a la sala de emergencias.  Nmeros de bper  -   Dr. Kowalski: [phone]  - Dra. Moye: [phone]  - Dra. Stewart: [phone]  En caso de inclemencias del tiempo, por favor llame a nuestra lnea principal al [phone] para una actualizacin sobre el estado de cualquier retraso o cierre.  Consejos para la medicacin en dermatologa: Por favor, guarde las cajas en las que vienen los medicamentos de uso tpico para ayudarle a seguir las instrucciones sobre dnde y cmo usarlos. Las farmacias generalmente imprimen las instrucciones del medicamento slo en las cajas y no directamente en los tubos del medicamento.   Si su medicamento es muy caro, por favor, pngase en contacto con nuestra oficina llamando al [phone] y presione la opcin 4 o envenos un mensaje a travs de MyChart.   No podemos decirle cul ser su copago por los medicamentos por adelantado ya que esto es diferente dependiendo de la cobertura de su seguro. Sin embargo, es posible que podamos encontrar un medicamento sustituto a menor costo o llenar un formulario para que el seguro cubra el medicamento que se considera necesario.   Si se requiere  una autorizacin previa para que su compaa de seguros cubra su medicamento, por favor permtanos de 1 a 2 das hbiles para completar este proceso.  Los precios de los medicamentos varan con frecuencia dependiendo del lugar de dnde se surte la receta y alguna farmacias pueden ofrecer precios ms baratos.  El sitio web www.goodrx.com tiene cupones para medicamentos de diferentes farmacias. Los precios aqu no tienen en cuenta lo que podra costar con la ayuda del seguro (puede ser ms barato con su seguro), pero el sitio web puede darle el precio si no utiliz ningn seguro.  - Puede imprimir el cupn correspondiente y llevarlo con su receta a la farmacia.  - Tambin puede pasar por nuestra oficina durante el horario de atencin regular y recoger una tarjeta de cupones de GoodRx.  - Si necesita que su receta se enve electrnicamente a una farmacia diferente, informe a nuestra oficina a travs de MyChart de [place] o por telfono llamando al [phone] y presione la opcin 4.  

## 2023-02-19 NOTE — Progress Notes (Signed)
Follow-Up Visit   Subjective  Carolyn Richard is a 67 y.o. female who presents for the following: Skin Cancer Screening and Full Body Skin Exam  The patient presents for Total-Body Skin Exam (TBSE) for skin cancer screening and mole check. The patient has spots, moles and lesions to be evaluated, some may be new or changing. She has a spot on her left shoulder that she scratches and picks at.  She uses fluocinolone 0.1% oil for seborrheic dermatitis of the face, improved. She also uses tretinoin 0.1% cream as needed for acne flares. It does cause some irritation.  No history of skin cancer.    The following portions of the chart were reviewed this encounter and updated as appropriate: medications, allergies, medical history  Review of Systems:  No other skin or systemic complaints except as noted in HPI or Assessment and Plan.  Objective  Well appearing patient in no apparent distress; mood and affect are within normal limits.  A full examination was performed including scalp, head, eyes, ears, nose, lips, neck, chest, axillae, abdomen, back, buttocks, bilateral upper extremities, bilateral lower extremities, hands, feet, fingers, toes, fingernails, and toenails. All findings within normal limits unless otherwise noted below.   Relevant physical exam findings are noted in the Assessment and Plan.  L medial post shoulder x 1 Erythematous stuck-on, waxy papule or plaque       Assessment & Plan   LENTIGINES, SEBORRHEIC KERATOSES, HEMANGIOMAS - Benign normal skin lesions - Benign-appearing - Call for any changes  MELANOCYTIC NEVI - Tan-brown and/or pink-flesh-colored symmetric macules and papules  left upper knee 4 mm flesh papule with superior brown edge, stable    left lower abdomen 5 mm med light brown macule slight irregular border with adjacent 2 mm light brown macule, stable   - Benign appearing on exam today - Observation - Call clinic for new or changing moles -  Recommend daily use of broad spectrum spf 30+ sunscreen to sun-exposed areas.   RECURRENT MELANOCYTIC NEVUS Exam: 0.6cm speckled brown macule within scar of the right mid back, biopsy proven benign nevus 11/29/2018. Photo today.  Treatment Plan: Benign appearing on exam today. Recommend observation. Call clinic for new or changing moles. Recommend daily use of broad spectrum spf 30+ sunscreen to sun-exposed areas.   ACTINIC DAMAGE - Chronic condition, secondary to cumulative UV/sun exposure - diffuse scaly erythematous macules with underlying dyspigmentation - Recommend daily broad spectrum sunscreen SPF 30+ to sun-exposed areas, reapply every 2 hours as needed.  - Staying in the shade or wearing long sleeves, sun glasses (UVA+UVB protection) and wide brim hats (4-inch brim around the entire circumference of the hat) are also recommended for sun protection.  - Call for new or changing lesions.  SKIN CANCER SCREENING PERFORMED TODAY.  SEBORRHEIC DERMATITIS Exam: Glabella clear today.  Chronic condition with duration or expected duration over one year. Currently well-controlled.   Seborrheic Dermatitis is a chronic persistent rash characterized by pinkness and scaling most commonly of the mid face but also can occur on the scalp (dandruff), ears; mid chest, mid back and groin.  It tends to be exacerbated by stress and cooler weather.  People who have neurologic disease may experience new onset or exacerbation of existing seborrheic dermatitis.  The condition is not curable but treatable and can be controlled.  Treatment Plan:  Cont fluocinolone 0.1% oil PRN flares.  Continue medicated shampoo in the shower, let sit several minutes before rinsing.   Sebaceous Hyperplasia - Small  yellow papules with a central dell on the bilateral temples - Benign-appearing - Observe. Call for changes.  CYSTIC ACNE  Exam: Clear today  Chronic condition with duration or expected duration over one year.  Currently well-controlled, but some irritation from tretinoin 0.1% cream.   Treatment Plan:  Decrease to tretinoin 0.05% cream Apply to face QHS as tolerated dsp 45g 3Rf. Rx printed.   Topical retinoid medications like tretinoin/Retin-A, adapalene/Differin, tazarotene/Fabior, and Epiduo/Epiduo Forte can cause dryness and irritation when first started. Only apply a pea-sized amount to the entire affected area. Avoid applying it around the eyes, edges of mouth and creases at the nose. If you experience irritation, use a good moisturizer first and/or apply the medicine less often. If you are doing well with the medicine, you can increase how often you use it until you are applying every night. Be careful with sun protection while using this medication as it can make you sensitive to the sun. This medicine should not be used by pregnant women.      Inflamed seborrheic keratosis L medial post shoulder x 1  Symptomatic, irritating, patient would like treated.  Destruction of lesion - L medial post shoulder x 1  Destruction method: cryotherapy   Informed consent: discussed and consent obtained   Lesion destroyed using liquid nitrogen: Yes   Region frozen until ice ball extended beyond lesion: Yes   Outcome: patient tolerated procedure well with no complications   Post-procedure details: wound care instructions given   Additional details:  Prior to procedure, discussed risks of blister formation, small wound, skin dyspigmentation, or rare scar following cryotherapy. Recommend Vaseline ointment to treated areas while healing.    Return in about 1 year (around 02/19/2024) for TBSE.  ICherlyn Labella, CMA, am acting as scribe for Willeen Niece, MD .   Documentation: I have reviewed the above documentation for accuracy and completeness, and I agree with the above.  Willeen Niece, MD

## 2023-04-11 ENCOUNTER — Other Ambulatory Visit: Payer: Self-pay | Admitting: Internal Medicine

## 2023-04-11 DIAGNOSIS — Z1231 Encounter for screening mammogram for malignant neoplasm of breast: Secondary | ICD-10-CM

## 2023-04-30 ENCOUNTER — Ambulatory Visit
Admission: RE | Admit: 2023-04-30 | Discharge: 2023-04-30 | Disposition: A | Discharge status: Home / Self Care | Payer: Medicare Other | Source: Ambulatory Visit | Attending: Internal Medicine | Admitting: Internal Medicine

## 2023-04-30 DIAGNOSIS — Z1231 Encounter for screening mammogram for malignant neoplasm of breast: Secondary | ICD-10-CM | POA: Insufficient documentation

## 2024-02-25 ENCOUNTER — Ambulatory Visit: Payer: Medicare Other | Admitting: Dermatology

## 2024-03-19 ENCOUNTER — Encounter: Payer: Self-pay | Admitting: Dermatology

## 2024-03-19 ENCOUNTER — Ambulatory Visit: Admitting: Dermatology

## 2024-03-19 DIAGNOSIS — D229 Melanocytic nevi, unspecified: Secondary | ICD-10-CM

## 2024-03-19 DIAGNOSIS — W908XXA Exposure to other nonionizing radiation, initial encounter: Secondary | ICD-10-CM | POA: Diagnosis not present

## 2024-03-19 DIAGNOSIS — L700 Acne vulgaris: Secondary | ICD-10-CM

## 2024-03-19 DIAGNOSIS — D1801 Hemangioma of skin and subcutaneous tissue: Secondary | ICD-10-CM

## 2024-03-19 DIAGNOSIS — Z1283 Encounter for screening for malignant neoplasm of skin: Secondary | ICD-10-CM

## 2024-03-19 DIAGNOSIS — D225 Melanocytic nevi of trunk: Secondary | ICD-10-CM

## 2024-03-19 DIAGNOSIS — L821 Other seborrheic keratosis: Secondary | ICD-10-CM

## 2024-03-19 DIAGNOSIS — L578 Other skin changes due to chronic exposure to nonionizing radiation: Secondary | ICD-10-CM | POA: Diagnosis not present

## 2024-03-19 DIAGNOSIS — L709 Acne, unspecified: Secondary | ICD-10-CM

## 2024-03-19 DIAGNOSIS — D2239 Melanocytic nevi of other parts of face: Secondary | ICD-10-CM

## 2024-03-19 DIAGNOSIS — L905 Scar conditions and fibrosis of skin: Secondary | ICD-10-CM

## 2024-03-19 DIAGNOSIS — D2272 Melanocytic nevi of left lower limb, including hip: Secondary | ICD-10-CM

## 2024-03-19 DIAGNOSIS — L219 Seborrheic dermatitis, unspecified: Secondary | ICD-10-CM

## 2024-03-19 DIAGNOSIS — L814 Other melanin hyperpigmentation: Secondary | ICD-10-CM

## 2024-03-19 DIAGNOSIS — L570 Actinic keratosis: Secondary | ICD-10-CM

## 2024-03-19 MED ORDER — FLUOCINOLONE ACETONIDE BODY 0.01 % EX OIL: 1.0000 | TOPICAL_OIL | CUTANEOUS | 3 refills | Status: AC

## 2024-03-19 MED ORDER — TRETINOIN 0.05 % EX CREA: TOPICAL_CREAM | CUTANEOUS | 6 refills | Status: AC

## 2024-03-19 NOTE — Progress Notes (Signed)
 Follow-Up Visit   Subjective  Carolyn Richard is a 68 y.o. female who presents for the following: Skin Cancer Screening and Full Body Skin Exam, Seb derm fluocinolone  0.1% oil PRN flares. Acne face Tretinoin  0.05% cr prn The patient presents for Total-Body Skin Exam (TBSE) for skin cancer screening and mole check. The patient has spots, moles and lesions to be evaluated, some may be new or changing and the patient may have concern these could be cancer.  Exam of nails limited by presence of nail polish.   The following portions of the chart were reviewed this encounter and updated as appropriate: medications, allergies, medical history  Review of Systems:  No other skin or systemic complaints except as noted in HPI or Assessment and Plan.  Objective  Well appearing patient in no apparent distress; mood and affect are within normal limits.  A full examination was performed including scalp, head, eyes, ears, nose, lips, neck, chest, axillae, abdomen, back, buttocks, bilateral upper extremities, bilateral lower extremities, hands, feet, fingers, toes, fingernails, and toenails. All findings within normal limits unless otherwise noted below.   Relevant physical exam findings are noted in the Assessment and Plan.  R malar cheek x 1 Pink scaly macules  - R medial upper eyelid   Assessment & Plan   SKIN CANCER SCREENING PERFORMED TODAY.  ACTINIC DAMAGE chest - Chronic condition, secondary to cumulative UV/sun exposure - diffuse scaly erythematous macules with underlying dyspigmentation - Recommend daily broad spectrum sunscreen SPF 30+ to sun-exposed areas, reapply every 2 hours as needed.  - Staying in the shade or wearing long sleeves, sun glasses (UVA+UVB protection) and wide brim hats (4-inch brim around the entire circumference of the hat) are also recommended for sun protection.  - Call for new or changing lesions.  LENTIGINES, SEBORRHEIC KERATOSES, HEMANGIOMAS - Benign normal  skin lesions - Benign-appearing - Call for any changes   MELANOCYTIC NEVI - Tan-brown and/or pink-flesh-colored symmetric macules and papules - Left upper knee 4 mm flesh papule with superior brown edge  - Left lower abdomen 5 mm med light brown macule slight irregular border with adjacent 2 mm light brown macule,   - R lat eyebrow flesh pap - Benign appearing on exam today, Stable compared to previous visit. - Observation - Call clinic for new or changing moles - Recommend daily use of broad spectrum spf 30+ sunscreen to sun-exposed areas.   LENTIGO Exam: R medial upper eyelid 4.66mm speckled tan macule, see photo Due to sun exposure, pt states no changes Treatment Plan: Benign-appearing, observe. Recommend wearing UV protection sunglasses when outdoors. Call for any changes   RECURRENT BENIGN MELANOCYTIC NEVUS Bx proven 11/29/2018 Exam: 0.6cm speckled brown macule within scar of the right mid back   Treatment Plan: Benign appearing on exam today. Stable. Recommend observation. Call clinic for new or changing moles. Recommend daily use of broad spectrum spf 30+ sunscreen to sun-exposed areas.  SEBORRHEIC DERMATITIS Face, scalp Exam: erythema glabella, forehead, pt states gets scaly  Chronic and persistent condition with duration or expected duration over one year. Condition is symptomatic/ bothersome to patient. Not currently at goal.   Seborrheic Dermatitis is a chronic persistent rash characterized by pinkness and scaling most commonly of the mid face but also can occur on the scalp (dandruff), ears; mid chest, mid back and groin.  It tends to be exacerbated by stress and cooler weather.  People who have neurologic disease may experience new onset or exacerbation of existing seborrheic dermatitis.  The condition is not curable but treatable and can be controlled.  Treatment Plan: Cont fluocinolone  0.1% oil every day PRN flares.  Start Vtama  qd to aa face prn flares, not  covered by insurance, samples given Continue otc Nizoral  shampoo 2-3x/wk, let sit several minutes before rinsing  Topical steroids (such as triamcinolone, fluocinolone , fluocinonide, mometasone , clobetasol, halobetasol, betamethasone, hydrocortisone) can cause thinning and lightening of the skin if they are used for too long in the same area. Your physician has selected the right strength medicine for your problem and area affected on the body. Please use your medication only as directed by your physician to prevent side effects.    ACNE  face Exam: closed comedones at nose, cheeks, cystic paps preauricular   Chronic and persistent condition with duration or expected duration over one year. Condition is improving with treatment but not currently at goal.    Treatment Plan: Cont Tretinoin  0.05% cr qhs to face for acne Start otc Neutrogena BP Stubborn acne to spot treat areas, samples given  Topical retinoid medications like tretinoin /Retin-A , adapalene/Differin, tazarotene/Fabior, and Epiduo/Epiduo Forte can cause dryness and irritation when first started. Only apply a pea-sized amount to the entire affected area. Avoid applying it around the eyes, edges of mouth and creases at the nose. If you experience irritation, use a good moisturizer first and/or apply the medicine less often. If you are doing well with the medicine, you can increase how often you use it until you are applying every night. Be careful with sun protection while using this medication as it can make you sensitive to the sun. This medicine should not be used by pregnant women.   Benzoyl peroxide can cause dryness and irritation of the skin. It can also bleach fabric. When used together with Aczone (dapsone) cream, it can stain the skin orange.   SCAR vs CYST R mid cheek Exam: 8.0 x 5.60mm firm flesh pap R mid cheek, pt states has been present for long time, no changes  Treatment Plan: Benign-appearing.  Observation.   Recheck on f/up. Call clinic for new or changing moles.  Recommend daily use of broad spectrum spf 30+ sunscreen to sun-exposed areas.    SKIN CANCER SCREENING   AK (ACTINIC KERATOSIS) R malar cheek x 1 Actinic keratoses are precancerous spots that appear secondary to cumulative UV radiation exposure/sun exposure over time. They are chronic with expected duration over 1 year. A portion of actinic keratoses will progress to squamous cell carcinoma of the skin. It is not possible to reliably predict which spots will progress to skin cancer and so treatment is recommended to prevent development of skin cancer.  Recommend daily broad spectrum sunscreen SPF 30+ to sun-exposed areas, reapply every 2 hours as needed.  Recommend staying in the shade or wearing long sleeves, sun glasses (UVA+UVB protection) and wide brim hats (4-inch brim around the entire circumference of the hat). Call for new or changing lesions. Destruction of lesion - R malar cheek x 1  Destruction method: cryotherapy   Informed consent: discussed and consent obtained   Timeout:  patient name, date of birth, surgical site, and procedure verified Lesion destroyed using liquid nitrogen: Yes   Region frozen until ice ball extended beyond lesion: Yes   Outcome: patient tolerated procedure well with no complications   Post-procedure details: wound care instructions given   Additional details:  Prior to procedure, discussed risks of blister formation, small wound, skin dyspigmentation, or rare scar following cryotherapy. Recommend Vaseline ointment to treated  areas while healing.  ACTINIC SKIN DAMAGE   LENTIGO   SEBORRHEIC KERATOSIS   HEMANGIOMA OF SKIN   NEVUS   SEBORRHEIC DERMATITIS   Return in about 1 year (around 03/19/2025) for TBSE, Hx of AKs.  I, Rollie Clipper, RMA, am acting as scribe for Artemio Larry, MD .   Documentation: I have reviewed the above documentation for accuracy and completeness, and I agree  with the above.  Artemio Larry, MD

## 2024-03-19 NOTE — Patient Instructions (Addendum)
 Cryotherapy Aftercare  Wash gently with soap and water everyday.   Apply Vaseline and Band-Aid daily until healed.   Vtama  Start Vtama  once daily as needed on face for seborrheic dermatitis   Due to recent changes in healthcare laws, you may see results of your pathology and/or laboratory studies on MyChart before the doctors have had a chance to review them. We understand that in some cases there may be results that are confusing or concerning to you. Please understand that not all results are received at the same time and often the doctors may need to interpret multiple results in order to provide you with the best plan of care or course of treatment. Therefore, we ask that you please give us  2 business days to thoroughly review all your results before contacting the office for clarification. Should we see a critical lab result, you will be contacted sooner.   If You Need Anything After Your Visit  If you have any questions or concerns for your doctor, please call our main line at [phone] and press option 4 to reach your doctor's medical assistant. If no one answers, please leave a voicemail as directed and we will return your call as soon as possible. Messages left after 4 pm will be answered the following business day.   You may also send us  a message via MyChart. We typically respond to MyChart messages within 1-2 business days.  For prescription refills, please ask your pharmacy to contact our office. Our fax number is [phone].  If you have an urgent issue when the clinic is closed that cannot wait until the next business day, you can page your doctor at the number below.    Please note that while we do our best to be available for urgent issues outside of office hours, we are not available 24/7.   If you have an urgent issue and are unable to reach us , you may choose to seek medical care at your doctor's office, retail clinic, urgent care center, or emergency room.  If you  have a medical emergency, please immediately call 911 or go to the emergency department.  Pager Numbers  - Dr. Bary Likes: [phone]  - Dr. Annette Barters: [phone]  - Dr. Felipe Horton: [phone]   In the event of inclement weather, please call our main line at [phone] for an update on the status of any delays or closures.  Dermatology Medication Tips: Please keep the boxes that topical medications come in in order to help keep track of the instructions about where and how to use these. Pharmacies typically print the medication instructions only on the boxes and not directly on the medication tubes.   If your medication is too expensive, please contact our office at [phone] option 4 or send us  a message through MyChart.   We are unable to tell what your co-pay for medications will be in advance as this is different depending on your insurance coverage. However, we may be able to find a substitute medication at lower cost or fill out paperwork to get insurance to cover a needed medication.   If a prior authorization is required to get your medication covered by your insurance company, please allow us  1-2 business days to complete this process.  Drug prices often vary depending on where the prescription is filled and some pharmacies may offer cheaper prices.  The website www.goodrx.com contains coupons for medications through different pharmacies. The prices here do not account for what the cost may be with help  from insurance (it may be cheaper with your insurance), but the website can give you the price if you did not use any insurance.  - You can print the associated coupon and take it with your prescription to the pharmacy.  - You may also stop by our office during regular business hours and pick up a GoodRx coupon card.  - If you need your prescription sent electronically to a different pharmacy, notify our office through [facility] or by phone at [phone] option  4.     Si Usted Necesita Algo Despus de Su Visita  Tambin puede enviarnos un mensaje a travs de Clinical cytogeneticist. Por lo general respondemos a los mensajes de MyChart en el transcurso de 1 a 2 das hbiles.  Para renovar recetas, por favor pida a su farmacia que se ponga en contacto con nuestra oficina. Franz Jacks de fax es [place] [phone].  Si tiene un asunto urgente cuando la clnica est cerrada y que no puede esperar hasta el siguiente da hbil, puede llamar/localizar a su doctor(a) al nmero que aparece a continuacin.   Por favor, tenga en cuenta que aunque hacemos todo lo posible para estar disponibles para asuntos urgentes fuera del horario de [place], no estamos disponibles las 24 horas del da, los 7 [address]  [address] de la [place].   Si tiene un problema urgente y no puede comunicarse con nosotros, puede optar por buscar atencin mdica  en el consultorio de su doctor(a), en una clnica privada, en un centro de atencin urgente o en una sala de emergencias.  Si tiene Engineer, drilling, por favor llame inmediatamente al 911 o vaya a la sala de emergencias.  Nmeros de bper  - Dr. Bary Likes: [phone]  - Dra. Annette Barters: [phone]  - Dr. Felipe Horton: [phone]   En caso de inclemencias del tiempo, por favor llame a Lajuan Pila principal al [phone] para una actualizacin sobre el [place] de cualquier retraso o cierre.  Consejos para la medicacin en dermatologa: Por favor, guarde las cajas en las que vienen los medicamentos de uso tpico para ayudarle a seguir las instrucciones sobre dnde y cmo usarlos. Las farmacias generalmente imprimen las instrucciones del medicamento slo en las cajas y no directamente en los tubos del [place].   Si su medicamento es muy caro, por favor, pngase en contacto con Bettyjane Brunet llamando al [phone] y presione la opcin 4 o envenos un mensaje a travs de Clinical cytogeneticist.   No podemos decirle cul ser su copago por los medicamentos por  adelantado ya que esto es diferente dependiendo de la cobertura de su seguro. Sin embargo, es posible que podamos encontrar un medicamento sustituto a Audiological scientist un formulario para que el seguro cubra el medicamento que se considera necesario.   Si se requiere una autorizacin previa para que su compaa de seguros [place] su medicamento, por favor permtanos de 1 a 2 das hbiles para completar este proceso.  Los precios de los medicamentos varan con frecuencia dependiendo del Environmental consultant de dnde se surte la receta y alguna farmacias pueden ofrecer precios ms baratos.  El sitio web www.goodrx.com tiene cupones para medicamentos de Health and safety inspector. Los precios aqu no tienen en cuenta lo que podra costar con la ayuda del seguro (puede ser ms barato con su seguro), pero el sitio web puede darle el precio si no utiliz Tourist information centre manager.  - Puede imprimir el cupn correspondiente y llevarlo con su receta a la farmacia.  - Tambin puede pasar por nuestra oficina durante el  horario de Visual merchandiser regular y Education officer, museum una tarjeta de cupones de GoodRx.  - Si necesita que su receta se enve electrnicamente a una farmacia diferente, informe a nuestra oficina a travs de MyChart de [place] o por telfono llamando al [phone] y presione la opcin 4.

## 2024-05-15 ENCOUNTER — Ambulatory Visit (INDEPENDENT_AMBULATORY_CARE_PROVIDER_SITE_OTHER): Admitting: Audiology

## 2024-05-15 ENCOUNTER — Institutional Professional Consult (permissible substitution) (INDEPENDENT_AMBULATORY_CARE_PROVIDER_SITE_OTHER): Admitting: Otolaryngology

## 2024-09-24 ENCOUNTER — Other Ambulatory Visit: Payer: Self-pay | Admitting: Internal Medicine

## 2024-09-24 DIAGNOSIS — Z1231 Encounter for screening mammogram for malignant neoplasm of breast: Secondary | ICD-10-CM

## 2024-10-21 ENCOUNTER — Telehealth (INDEPENDENT_AMBULATORY_CARE_PROVIDER_SITE_OTHER): Payer: Self-pay | Admitting: Audiology

## 2024-10-21 NOTE — Telephone Encounter (Signed)
 Called patient and left a voicemail requesting a call back to move appointment for a Hearing Evaluation on 11/17/2024 from Dr. Tiney to Dr. Lacinda at the same time, 1pm, because Dr. Tiney will be Out of the Office that afternoon.  Also, Consult with Dr. Karis would be rescheduled to 2pm same day instead of 1:30pm to allow Dr. Lacinda enough time for testing.  Also sent a MyChart message.

## 2024-11-17 ENCOUNTER — Institutional Professional Consult (permissible substitution) (INDEPENDENT_AMBULATORY_CARE_PROVIDER_SITE_OTHER): Admitting: Otolaryngology

## 2024-11-17 ENCOUNTER — Ambulatory Visit (INDEPENDENT_AMBULATORY_CARE_PROVIDER_SITE_OTHER): Admitting: Audiology

## 2024-11-27 ENCOUNTER — Institutional Professional Consult (permissible substitution) (INDEPENDENT_AMBULATORY_CARE_PROVIDER_SITE_OTHER): Admitting: Otolaryngology

## 2024-11-27 ENCOUNTER — Ambulatory Visit (INDEPENDENT_AMBULATORY_CARE_PROVIDER_SITE_OTHER): Admitting: Audiology

## 2025-04-07 ENCOUNTER — Encounter: Admitting: Dermatology
